# Patient Record
Sex: Female | Born: 1975 | Race: White | Hispanic: No | Smoking: Former smoker
Health system: Southern US, Community
[De-identification: ages and names within clinical notes are randomized; demographics above are authoritative.]

## PROBLEM LIST (undated history)

## (undated) DIAGNOSIS — E7800 Pure hypercholesterolemia, unspecified: Secondary | ICD-10-CM

## (undated) DIAGNOSIS — G43909 Migraine, unspecified, not intractable, without status migrainosus: Secondary | ICD-10-CM

## (undated) DIAGNOSIS — K219 Gastro-esophageal reflux disease without esophagitis: Secondary | ICD-10-CM

## (undated) DIAGNOSIS — M419 Scoliosis, unspecified: Secondary | ICD-10-CM

## (undated) DIAGNOSIS — G459 Transient cerebral ischemic attack, unspecified: Secondary | ICD-10-CM

## (undated) HISTORY — DX: Migraine, unspecified, not intractable, without status migrainosus: G43.909

## (undated) HISTORY — DX: Pure hypercholesterolemia, unspecified: E78.00

## (undated) HISTORY — PX: ELBOW SURGERY: SHX618

---

## 2010-07-15 ENCOUNTER — Other Ambulatory Visit: Admission: RE | Admit: 2010-07-15 | Discharge: 2010-07-15 | Payer: Self-pay | Admitting: Gynecology

## 2010-07-15 ENCOUNTER — Ambulatory Visit: Payer: Self-pay | Admitting: Gynecology

## 2010-07-18 ENCOUNTER — Ambulatory Visit: Payer: Self-pay | Admitting: Gynecology

## 2011-09-15 ENCOUNTER — Other Ambulatory Visit (HOSPITAL_COMMUNITY)
Admission: RE | Admit: 2011-09-15 | Discharge: 2011-09-15 | Disposition: A | Discharge status: Home / Self Care | Payer: 59 | Source: Ambulatory Visit | Attending: Gynecology | Admitting: Gynecology

## 2011-09-15 ENCOUNTER — Ambulatory Visit (INDEPENDENT_AMBULATORY_CARE_PROVIDER_SITE_OTHER): Payer: 59 | Admitting: Gynecology

## 2011-09-15 ENCOUNTER — Encounter: Payer: Self-pay | Admitting: Gynecology

## 2011-09-15 VITALS — BP 110/70 | Ht 62.5 in | Wt 143.0 lb

## 2011-09-15 DIAGNOSIS — Z13220 Encounter for screening for lipoid disorders: Secondary | ICD-10-CM

## 2011-09-15 DIAGNOSIS — B3731 Acute candidiasis of vulva and vagina: Secondary | ICD-10-CM

## 2011-09-15 DIAGNOSIS — N898 Other specified noninflammatory disorders of vagina: Secondary | ICD-10-CM

## 2011-09-15 DIAGNOSIS — Z01419 Encounter for gynecological examination (general) (routine) without abnormal findings: Secondary | ICD-10-CM

## 2011-09-15 DIAGNOSIS — B373 Candidiasis of vulva and vagina: Secondary | ICD-10-CM

## 2011-09-15 DIAGNOSIS — Z131 Encounter for screening for diabetes mellitus: Secondary | ICD-10-CM

## 2011-09-15 DIAGNOSIS — J45909 Unspecified asthma, uncomplicated: Secondary | ICD-10-CM | POA: Insufficient documentation

## 2011-09-15 MED ORDER — FLUCONAZOLE 150 MG PO TABS: 150.0000 mg | ORAL_TABLET | Freq: Once | ORAL | Status: AC

## 2011-09-15 NOTE — Progress Notes (Signed)
[name] [name] 11/25/1975 [id number]        36 y.o.  for annual exam.  Doing well no complaints using rhythm method of contraception.  Past medical history,surgical history, medications, allergies, family history and social history were all reviewed and documented in the EPIC chart. ROS:  Was performed and pertinent positives and negatives are included in the history.  Exam: chaperone present Filed Vitals:   09/15/11 1541  BP: 110/70   General appearance  Normal Skin grossly normal Head/Neck normal with no cervical or supraclavicular adenopathy thyroid normal Lungs  clear Cardiac RR, without RMG Abdominal  soft, nontender, without masses, organomegaly or hernia Breasts  examined lying and sitting without masses, retractions, discharge or axillary adenopathy. Pelvic  Ext/BUS/vagina  normal with white discharge  Cervix  normal  Pap done  Uterus  anteverted, normal size, shape and contour, midline and mobile nontender   Adnexa  Without masses or tenderness    Anus and perineum  normal   Rectovaginal  normal sphincter tone without palpated masses or tenderness.    Assessment/Plan:  35 y.o. female for annual exam.    1. White discharge. KOH wet prep is positive for yeast we'll treat with Diflucan 150x1 dose follow up if symptoms persist or recur 2. Contraception. Patient is using rhythm method. She understands the risk of failure and is on a multivitamin with folic acid. They're contemplating pregnancy. I discussed the issues of pregnancy in an older woman to include decreased fecundity, increased risk of medical issues such as gestational diabetes and gestational hypertension as well as increased risk of chromosomal abnormalities in the fetus. She knows I do not do obstetrics and if she chooses pregnancy then we'll refer to an obstetrician. Otherwise she will see [place] in a year. 3. Health maintenance. Self breast exams on a monthly basis discussed urged.  Baseline labs to include CBC urinalysis  lipid profile glucose were ordered. Patient will see me in a year see me she continues well sooner as needed.    [name] MD, 4:05 PM 09/15/2011

## 2011-09-16 ENCOUNTER — Other Ambulatory Visit (INDEPENDENT_AMBULATORY_CARE_PROVIDER_SITE_OTHER): Payer: 59 | Admitting: *Deleted

## 2011-09-16 DIAGNOSIS — Z131 Encounter for screening for diabetes mellitus: Secondary | ICD-10-CM

## 2011-09-16 DIAGNOSIS — Z01419 Encounter for gynecological examination (general) (routine) without abnormal findings: Secondary | ICD-10-CM

## 2011-09-16 DIAGNOSIS — Z13220 Encounter for screening for lipoid disorders: Secondary | ICD-10-CM

## 2011-09-24 ENCOUNTER — Telehealth: Payer: Self-pay | Admitting: *Deleted

## 2011-09-24 NOTE — Telephone Encounter (Signed)
Pt informed she  will need a repeat pap due to not enough cells collected during original pap. Pt will scheduled appointment.

## 2011-10-05 ENCOUNTER — Other Ambulatory Visit (HOSPITAL_COMMUNITY)
Admission: RE | Admit: 2011-10-05 | Discharge: 2011-10-05 | Disposition: A | Discharge status: Home / Self Care | Payer: 59 | Source: Ambulatory Visit | Attending: Gynecology | Admitting: Gynecology

## 2011-10-05 ENCOUNTER — Encounter: Payer: Self-pay | Admitting: Gynecology

## 2011-10-05 ENCOUNTER — Ambulatory Visit (INDEPENDENT_AMBULATORY_CARE_PROVIDER_SITE_OTHER): Payer: 59 | Admitting: Gynecology

## 2011-10-05 DIAGNOSIS — Z01419 Encounter for gynecological examination (general) (routine) without abnormal findings: Secondary | ICD-10-CM | POA: Insufficient documentation

## 2011-10-05 DIAGNOSIS — R87616 Satisfactory cervical smear but lacking transformation zone: Secondary | ICD-10-CM

## 2011-10-05 NOTE — Progress Notes (Signed)
Patient also for Pap smear. Her recent Pap smear was in adequate in that no endocervical cells were seen.  Exam Pelvic external BUS vagina normal cervix with stenotic external os. Pap was repeated noting endocervical mucus on the brush.  Follow up Pap smear, assuming normal then she'll follow up routinely with she's due for her next annual. Her husband are attempting pregnancy she's a multivitamin with folic acid and will follow up when she misses a menses. Otherwise she'll see [place] in a year.

## 2011-12-21 ENCOUNTER — Telehealth: Payer: Self-pay | Admitting: *Deleted

## 2011-12-21 NOTE — Telephone Encounter (Signed)
Pt informed with the below note. 

## 2011-12-21 NOTE — Telephone Encounter (Signed)
OTC "prenatal vitamins" okay

## 2011-12-21 NOTE — Telephone Encounter (Signed)
Pt spoke with you at 09/15/11 office visit about trying to conceive. She would like to know if there are any special prenatal vit. That she would need? OTC okay or Rx?

## 2012-01-22 DIAGNOSIS — G459 Transient cerebral ischemic attack, unspecified: Secondary | ICD-10-CM

## 2012-01-22 HISTORY — DX: Transient cerebral ischemic attack, unspecified: G45.9

## 2012-02-14 ENCOUNTER — Observation Stay (HOSPITAL_COMMUNITY): Payer: 59

## 2012-02-14 ENCOUNTER — Emergency Department (HOSPITAL_COMMUNITY): Payer: 59

## 2012-02-14 ENCOUNTER — Observation Stay (HOSPITAL_COMMUNITY)
Admission: EM | Admit: 2012-02-14 | Discharge: 2012-02-16 | Disposition: A | Discharge status: Home / Self Care | Payer: 59 | Source: Ambulatory Visit | Attending: Family Medicine | Admitting: Family Medicine

## 2012-02-14 ENCOUNTER — Other Ambulatory Visit: Payer: Self-pay

## 2012-02-14 ENCOUNTER — Encounter (HOSPITAL_COMMUNITY): Payer: Self-pay | Admitting: *Deleted

## 2012-02-14 DIAGNOSIS — Z23 Encounter for immunization: Secondary | ICD-10-CM | POA: Insufficient documentation

## 2012-02-14 DIAGNOSIS — G459 Transient cerebral ischemic attack, unspecified: Principal | ICD-10-CM | POA: Diagnosis present

## 2012-02-14 DIAGNOSIS — J45909 Unspecified asthma, uncomplicated: Secondary | ICD-10-CM | POA: Diagnosis present

## 2012-02-14 DIAGNOSIS — R079 Chest pain, unspecified: Secondary | ICD-10-CM | POA: Diagnosis present

## 2012-02-14 DIAGNOSIS — E785 Hyperlipidemia, unspecified: Secondary | ICD-10-CM | POA: Diagnosis present

## 2012-02-14 DIAGNOSIS — K219 Gastro-esophageal reflux disease without esophagitis: Secondary | ICD-10-CM | POA: Diagnosis present

## 2012-02-14 HISTORY — DX: Scoliosis, unspecified: M41.9

## 2012-02-14 HISTORY — DX: Gastro-esophageal reflux disease without esophagitis: K21.9

## 2012-02-14 LAB — CBC
HCT: 36.7 % (ref 36.0–46.0)
Hemoglobin: 12 g/dL (ref 12.0–15.0)
MCH: 26.4 pg (ref 26.0–34.0)
MCHC: 32.7 g/dL (ref 30.0–36.0)
MCV: 80.8 fL (ref 78.0–100.0)
Platelets: 190 10*3/uL (ref 150–400)
RBC: 4.54 MIL/uL (ref 3.87–5.11)
RDW: 12.7 % (ref 11.5–15.5)
WBC: 8.5 10*3/uL (ref 4.0–10.5)

## 2012-02-14 LAB — COMPREHENSIVE METABOLIC PANEL
ALT: 16 U/L (ref 0–35)
AST: 19 U/L (ref 0–37)
Albumin: 3.9 g/dL (ref 3.5–5.2)
Alkaline Phosphatase: 47 U/L (ref 39–117)
BUN: 18 mg/dL (ref 6–23)
CO2: 22 mEq/L (ref 19–32)
Calcium: 9.4 mg/dL (ref 8.4–10.5)
Chloride: 102 mEq/L (ref 96–112)
Creatinine, Ser: 0.78 mg/dL (ref 0.50–1.10)
GFR calc Af Amer: >90 mL/min (ref >90)
GFR calc non Af Amer: >90 mL/min (ref >90)
Glucose, Bld: 105 mg/dL — ABNORMAL HIGH (ref 70–99)
Potassium: 3.4 mEq/L — ABNORMAL LOW (ref 3.5–5.1)
Sodium: 137 mEq/L (ref 135–145)
Total Bilirubin: 0.5 mg/dL (ref 0.3–1.2)
Total Protein: 6.4 g/dL (ref 6.0–8.3)

## 2012-02-14 LAB — PROTIME-INR
INR: 1.08 (ref 0.00–1.49)
Prothrombin Time: 14.2 seconds (ref 11.6–15.2)

## 2012-02-14 LAB — DIFFERENTIAL
Basophils Absolute: 0 10*3/uL (ref 0.0–0.1)
Basophils Relative: 1 % (ref 0–1)
Eosinophils Absolute: 0.3 10*3/uL (ref 0.0–0.7)
Eosinophils Relative: 3 % (ref 0–5)
Lymphocytes Relative: 37 % (ref 12–46)
Lymphs Abs: 3.1 10*3/uL (ref 0.7–4.0)
Monocytes Absolute: 0.6 10*3/uL (ref 0.1–1.0)
Monocytes Relative: 7 % (ref 3–12)
Neutro Abs: 4.5 10*3/uL (ref 1.7–7.7)
Neutrophils Relative %: 52 % (ref 43–77)

## 2012-02-14 LAB — POCT I-STAT, CHEM 8
BUN: 19 mg/dL (ref 6–23)
Calcium, Ion: 1.2 mmol/L (ref 1.12–1.32)
Chloride: 104 mEq/L (ref 96–112)
Creatinine, Ser: 0.9 mg/dL (ref 0.50–1.10)
Glucose, Bld: 110 mg/dL — ABNORMAL HIGH (ref 70–99)
HCT: 37 % (ref 36.0–46.0)
Hemoglobin: 12.6 g/dL (ref 12.0–15.0)
Potassium: 3.4 mEq/L — ABNORMAL LOW (ref 3.5–5.1)
Sodium: 140 mEq/L (ref 135–145)
TCO2: 24 mmol/L (ref 0–100)

## 2012-02-14 LAB — CARDIAC PANEL(CRET KIN+CKTOT+MB+TROPI)
CK, MB: 1.6 ng/mL (ref 0.3–4.0)
Relative Index: INVALID (ref 0.0–2.5)
Total CK: 79 U/L (ref 7–177)
Troponin I: <0.3 ng/mL (ref <0.30)

## 2012-02-14 LAB — APTT: aPTT: 28 seconds (ref 24–37)

## 2012-02-14 LAB — GLUCOSE, CAPILLARY: Glucose-Capillary: 134 mg/dL — ABNORMAL HIGH (ref 70–99)

## 2012-02-14 LAB — CK TOTAL AND CKMB (NOT AT ARMC)
CK, MB: 1.6 ng/mL (ref 0.3–4.0)
Relative Index: INVALID (ref 0.0–2.5)
Total CK: 82 U/L (ref 7–177)

## 2012-02-14 LAB — POCT PREGNANCY, URINE: Preg Test, Ur: NEGATIVE

## 2012-02-14 LAB — TROPONIN I: Troponin I: <0.3 ng/mL (ref <0.30)

## 2012-02-14 MED ORDER — POTASSIUM CHLORIDE 10 MEQ/100ML IV SOLN
10.0000 meq | INTRAVENOUS | Status: AC
  Administered 2012-02-14 – 2012-02-15 (×2): 10 meq via INTRAVENOUS
  Filled 2012-02-14 (×2): qty 100

## 2012-02-14 MED ORDER — LEVALBUTEROL TARTRATE 45 MCG/ACT IN AERO
1.0000 | INHALATION_SPRAY | RESPIRATORY_TRACT | Status: DC | PRN
  Filled 2012-02-14: qty 15

## 2012-02-14 MED ORDER — ASPIRIN 325 MG PO TABS
325.0000 mg | ORAL_TABLET | Freq: Every day | ORAL | Status: DC
  Administered 2012-02-15 – 2012-02-16 (×2): 325 mg via ORAL
  Filled 2012-02-14 (×2): qty 1

## 2012-02-14 MED ORDER — IOHEXOL 350 MG/ML SOLN
50.0000 mL | Freq: Once | INTRAVENOUS | Status: AC | PRN
  Administered 2012-02-14: 50 mL via INTRAVENOUS

## 2012-02-14 MED ORDER — FLUTICASONE PROPIONATE 50 MCG/ACT NA SUSP
1.0000 | Freq: Every day | NASAL | Status: DC
  Administered 2012-02-15 – 2012-02-16 (×2): 1 via NASAL
  Filled 2012-02-14: qty 16

## 2012-02-14 MED ORDER — ASPIRIN 81 MG PO CHEW
CHEWABLE_TABLET | ORAL | Status: AC
  Administered 2012-02-14: 324 mg
  Filled 2012-02-14: qty 4

## 2012-02-14 MED ORDER — HYDROMORPHONE HCL PF 1 MG/ML IJ SOLN
1.0000 mg | INTRAMUSCULAR | Status: DC | PRN
  Filled 2012-02-14: qty 1

## 2012-02-14 MED ORDER — PNEUMOCOCCAL VAC POLYVALENT 25 MCG/0.5ML IJ INJ
0.5000 mL | INJECTION | INTRAMUSCULAR | Status: AC
  Administered 2012-02-15: 0.5 mL via INTRAMUSCULAR
  Filled 2012-02-14: qty 0.5

## 2012-02-14 MED ORDER — ENOXAPARIN SODIUM 40 MG/0.4ML ~~LOC~~ SOLN
40.0000 mg | SUBCUTANEOUS | Status: DC
  Administered 2012-02-15 – 2012-02-16 (×2): 40 mg via SUBCUTANEOUS
  Filled 2012-02-14 (×2): qty 0.4

## 2012-02-14 NOTE — ED Notes (Signed)
Patient arrived from CT.  Dr. Roseanne Reno at the bedside along with the rapid response.

## 2012-02-14 NOTE — ED Notes (Signed)
EDP notified who came in to assess pt and peak with her

## 2012-02-14 NOTE — ED Notes (Signed)
Pt was at dinner when she began having left sided CP and numbness on the left side of body ("entire left side of body feels different"). Pt has some sob with this, pt also has some nausea with this.  No diaphoresis.  Pt is alert and oriented at this time.  This began 19:30

## 2012-02-14 NOTE — Code Documentation (Signed)
35 yo wf brought in by pvt vehicle for onset of stroke symptoms at 103.  Pt was eating dinner with her family when she noticed an acute onset on left sided numbness and tingling from face to foot.  LSN [id number], code stroke called 2024, pt arival 2007, EDP exam 2035, stroke team arrival 2035, pt arrival in CT 2034, CT read by neurologist 2038, phlebotomist arrival 2038.  Pt also with CTA head & neck completed.  Code stroke canceled 2103 by Dr. Roseanne Reno.

## 2012-02-14 NOTE — ED Notes (Signed)
Denies pain refused Dilaudid

## 2012-02-14 NOTE — H&P (Signed)
DATE OF ADMISSION:  02/14/2012  PCP:    Benita Stabile, MD, MD   Chief Complaint:  Weakness and Numbness Left Side   HPI: [name] [name] is an 36 y.o. female who presents to the ED with complaints of sudden onset weakness and numbness of the left side of her body at 7:30 pm.  She denied having headache but did report having chest pain.  She presented to the ED and A Code Stroke was called.  She was seen by Neurology and her symptoms were beginning to abate, so the code stroke was cancelled, and her CT scan of the Brain returned with negative.  She was referred for admission for further workup.    Past Medical History  Diagnosis Date  . Asthma   . GERD (gastroesophageal reflux disease)   . Scoliosis     Past Surgical History  Procedure Date  . Cesarean section 1997    boy    Medications:  HOME MEDS: Prior to Admission medications   Medication Sig Start Date End Date Taking? Authorizing Provider  levalbuterol [facility] HFA) 45 MCG/ACT inhaler Inhale 1-2 puffs into the lungs every 4 (four) hours as needed. For asthma.   Yes Historical Provider, MD  mometasone (NASONEX) 50 MCG/ACT nasal spray Place 2 sprays into the nose daily.     Yes Historical Provider, MD  montelukast (SINGULAIR) 10 MG tablet Take 10 mg by mouth at bedtime.   Yes Historical Provider, MD  Multiple Vitamin (MULITIVITAMIN WITH MINERALS) TABS Take 1 tablet by mouth daily.   Yes Historical Provider, MD    Allergies:  No Known Allergies  Social History:   reports that she quit smoking about 8 months ago. She has never used smokeless tobacco. She reports that she does not drink alcohol or use illicit drugs.  Family History: Family History  Problem Relation Age of Onset  . Diabetes Father   . Colon cancer Father   . Hypertension Father   . Hyperlipidemia Father   . Cancer Father     prostate  . Cancer Maternal Uncle   . Cancer Maternal Grandfather   . Cancer Paternal Grandmother     stomach  . Cancer  Paternal Grandfather     lung    Review of Systems:  The patient denies anorexia, fever, weight loss,, vision loss, decreased hearing, hoarseness, syncope, dyspnea on exertion, peripheral edema, balance deficits, hemoptysis, abdominal pain, melena, hematochezia, severe indigestion/heartburn, hematuria, incontinence, genital sores, muscle weakness, suspicious skin lesions, transient blindness, difficulty walking, depression, unusual weight change, abnormal bleeding, enlarged lymph nodes, angioedema, and breast masses.   Physical Exam:  GEN:  Pleasant 36 year old well nourished and well developed Caucasian female examined  and in no acute distress; cooperative with exam Filed Vitals:   02/14/12 2007 02/14/12 2049 02/14/12 2108 02/14/12 2122  BP: 122/70 119/64  115/67  Pulse:  84    Temp: 97.9 F (36.6 C)  99.2 F (37.3 C)   TempSrc: Oral     Resp: 18   16  SpO2: 100% 100%  99%   Blood pressure 115/67, pulse 84, temperature 99.2 F (37.3 C), temperature source Oral, resp. rate 16, last menstrual period 01/31/2012, SpO2 99.00%. PSYCH: SHe is alert and oriented x4; does not appear anxious does not appear depressed; affect is normal HEENT: Normocephalic and Atraumatic, Mucous membranes pink; PERRLA; EOM intact; Fundi:  Benign;  No scleral icterus, Nares: Patent, Oropharynx: Clear, Fair Dentition, Neck:  FROM, no cervical lymphadenopathy nor thyromegaly or carotid  bruit; no JVD; Breasts:: Not examined CHEST WALL: No tenderness CHEST: Normal respiration, clear to auscultation bilaterally HEART: Regular rate and rhythm; no murmurs rubs or gallops BACK: No kyphosis or scoliosis; no CVA tenderness ABDOMEN: Positive Bowel Sounds, Soft non-tender; no masses, no organomegaly.   Rectal Exam: Not done EXTREMITIES: No cyanosis, clubbing or edema; no ulcerations. Genitalia: not examined PULSES: 2+ and symmetric SKIN: Normal hydration no rash or ulceration CNS: Cranial nerves 2-12 grossly intact no  focal neurologic deficit, No Pronator drifting, Equal Grip strength, Able to move all 4 extremities, Gait was not assessed.     Labs & Imaging Results for orders placed during the hospital encounter of 02/14/12 (from the past 48 hour(s))  GLUCOSE, CAPILLARY     Status: Abnormal   Collection Time   02/14/12  8:21 PM      Component Value Range Comment   Glucose-Capillary 134 (*) 70 - 99 (mg/dL)   PROTIME-INR     Status: Normal   Collection Time   02/14/12  8:31 PM      Component Value Range Comment   Prothrombin Time 14.2  11.6 - 15.2 (seconds)    INR 1.08  0.00 - 1.49    APTT     Status: Normal   Collection Time   02/14/12  8:31 PM      Component Value Range Comment   aPTT 28  24 - 37 (seconds)   CBC     Status: Normal   Collection Time   02/14/12  8:31 PM      Component Value Range Comment   WBC 8.5  4.0 - 10.5 (K/uL)    RBC 4.54  3.87 - 5.11 (MIL/uL)    Hemoglobin 12.0  12.0 - 15.0 (g/dL)    HCT 16.1  09.6 - 04.5 (%)    MCV 80.8  78.0 - 100.0 (fL)    MCH 26.4  26.0 - 34.0 (pg)    MCHC 32.7  30.0 - 36.0 (g/dL)    RDW 40.9  81.1 - 91.4 (%)    Platelets 190  150 - 400 (K/uL)   DIFFERENTIAL     Status: Normal   Collection Time   02/14/12  8:31 PM      Component Value Range Comment   Neutrophils Relative 52  43 - 77 (%)    Neutro Abs 4.5  1.7 - 7.7 (K/uL)    Lymphocytes Relative 37  12 - 46 (%)    Lymphs Abs 3.1  0.7 - 4.0 (K/uL)    Monocytes Relative 7  3 - 12 (%)    Monocytes Absolute 0.6  0.1 - 1.0 (K/uL)    Eosinophils Relative 3  0 - 5 (%)    Eosinophils Absolute 0.3  0.0 - 0.7 (K/uL)    Basophils Relative 1  0 - 1 (%)    Basophils Absolute 0.0  0.0 - 0.1 (K/uL)   COMPREHENSIVE METABOLIC PANEL     Status: Abnormal   Collection Time   02/14/12  8:31 PM      Component Value Range Comment   Sodium 137  135 - 145 (mEq/L)    Potassium 3.4 (*) 3.5 - 5.1 (mEq/L)    Chloride 102  96 - 112 (mEq/L)    CO2 22  19 - 32 (mEq/L)    Glucose, Bld 105 (*) 70 - 99 (mg/dL)    BUN 18   6 - 23 (mg/dL)    Creatinine, Ser 7.82  0.50 - 1.10 (mg/dL)  Calcium 9.4  8.4 - 10.5 (mg/dL)    Total Protein 6.4  6.0 - 8.3 (g/dL)    Albumin 3.9  3.5 - 5.2 (g/dL)    AST 19  0 - 37 (U/L)    ALT 16  0 - 35 (U/L)    Alkaline Phosphatase 47  39 - 117 (U/L)    Total Bilirubin 0.5  0.3 - 1.2 (mg/dL)    GFR calc non Af Amer >90  >90 (mL/min)    GFR calc Af Amer >90  >90 (mL/min)   CK TOTAL AND CKMB     Status: Normal   Collection Time   02/14/12  8:33 PM      Component Value Range Comment   Total CK 82  7 - 177 (U/L)    CK, MB 1.6  0.3 - 4.0 (ng/mL)    Relative Index RELATIVE INDEX IS INVALID  0.0 - 2.5    TROPONIN I     Status: Normal   Collection Time   02/14/12  8:33 PM      Component Value Range Comment   Troponin I <0.30  <0.30 (ng/mL)   POCT I-STAT, CHEM 8     Status: Abnormal   Collection Time   02/14/12  8:50 PM      Component Value Range Comment   Sodium 140  135 - 145 (mEq/L)    Potassium 3.4 (*) 3.5 - 5.1 (mEq/L)    Chloride 104  96 - 112 (mEq/L)    BUN 19  6 - 23 (mg/dL)    Creatinine, Ser 1.61  0.50 - 1.10 (mg/dL)    Glucose, Bld 096 (*) 70 - 99 (mg/dL)    Calcium, Ion 0.45  1.12 - 1.32 (mmol/L)    TCO2 24  0 - 100 (mmol/L)    Hemoglobin 12.6  12.0 - 15.0 (g/dL)    HCT 40.9  81.1 - 91.4 (%)   POCT PREGNANCY, URINE     Status: Normal   Collection Time   02/14/12  9:52 PM      Component Value Range Comment   Preg Test, Ur NEGATIVE  NEGATIVE     Ct Head Wo Contrast  02/14/2012   *RADIOLOGY REPORT*  Clinical Data:  Left side weakness.  CT HEAD WITHOUT CONTRAST  Technique: Contiguous axial images were obtained from the base of the skull through the vertex without intravenous contrast.  Comparison:   None.  Findings:  The brain appears normal without evidence of acute infarction, hemorrhage, mass lesion, mass effect, midline shift or abnormal extra-axial fluid collection.  There is no hydrocephalus or pneumocephalus.  Minimal ethmoid air cell disease is noted. Calvarium  intact.  IMPRESSION: No acute finding.  Findings called to Dr. Fonnie Jarvis at the time of interpretation.  Original Report Authenticated By: Bernadene Bell. D'ALESSIO, M.D.     EKG:  NSR No Acute ST segment changes, Diffuse artifact present.      Assessment: Present on Admission:  .TIA (transient ischemic attack) .Chest pain .Hyperlipidemia .GERD (gastroesophageal reflux disease) .Asthma   Plan:    Admit to 23 Hour Observation Status for TIA Workup Neuro checks, MRI/MRA in AM.  Cardiac Enzymes Reconcile Home Medications Check Fasting Lipid Panel ASA therapy DVT prophylaxis Other plans as per orders.    CODE STATUS:      FULL CODE        Sheletha Bow C 02/14/2012, 10:39 PM

## 2012-02-14 NOTE — ED Notes (Signed)
NIH scale done by April.  Information does not show up on the ED information but will show up on the information for the floor

## 2012-02-14 NOTE — ED Notes (Signed)
Patient refused xray stating she has been trying to get pregnant. Dr. Fonnie Jarvis is aware.

## 2012-02-14 NOTE — Consult Note (Signed)
Referring Physician: Dr. Fonnie Jarvis    Chief Complaint: Sudden onset of left face arm and leg numbness as well as mild weakness involving left extremities.  HPI: [name] [name] is an 36 y.o. female with a history of asthma and GERD presenting with sudden onset of numbness involving left face arm and leg as well as mild weakness involving left arm and leg at about 1930 this evening while eating dinner. She is no previous history of stroke nor TIA. He was also no history of migraine headaches. Patient has not experienced a headache with current symptoms. She also has not experienced changes in cognitive functioning. She's had no problems with speech no swallowing. She has not been on antiplatelet therapy. CT scan of her head showed no acute intracranial abnormality. CT of the head and neck with contrast was completed. Results are pending. NIH stroke score was 3. Patient's symptoms were resolving. She was not considered a candidate for intravenous thrombolytic therapy with TPA.  LSN: 1930 today tPA Given: No: Mild deficits which were resolving. MRankin: 0  Past Medical History  Diagnosis Date  . Asthma   . GERD (gastroesophageal reflux disease)     Family History  Problem Relation Age of Onset  . Diabetes Father   . Colon cancer Father   . Hypertension Father   . Hyperlipidemia Father   . Cancer Father     prostate  . Cancer Maternal Uncle   . Cancer Maternal Grandfather   . Cancer Paternal Grandmother     stomach  . Cancer Paternal Grandfather     lung     Medications: Prior to Admission: Xopenex 1-2 puffs every 4 hours when necessary  Nasonex nasal spray 2 sprays in each nostril daily  Singulair 10 mg at bedtime  Multivitamin 1 per day  Physical Examination: Blood pressure 119/64, pulse 84, temperature 99.2 F (37.3 C), temperature source Oral, resp. rate 18, last menstrual period 01/31/2012, SpO2 100.00%.  Neurologic Examination: Mental Status: Alert, oriented, thought content  appropriate.  Speech fluent without evidence of aphasia. Able to follow commands without difficulty. Cranial Nerves: II-Visual fields intact and normal. III/IV/VI-Extraocular movements full and conjugate.  Pupils reacted normally to light. V/VII-slight left facial numbness; no facial weakness VIII-grossly intact X-normal speech XII-midline tongue extension Motor: Minimal drift of left arm and left leg against gravity, otherwise normal. Sensory: Pinprick and light touch slightly reduced over left extremities compared to the right extremities Deep Tendon Reflexes: 2+ and symmetric throughout Plantars: Downgoing bilaterally Cerebellar: Normal finger-to-nose and  heel-to-shin test.   Carotid auscultation: Normal   Ct Head Wo Contrast  02/14/2012   *RADIOLOGY REPORT*  Clinical Data:  Left side weakness.  CT HEAD WITHOUT CONTRAST  Technique: Contiguous axial images were obtained from the base of the skull through the vertex without intravenous contrast.  Comparison:   None.  Findings:  The brain appears normal without evidence of acute infarction, hemorrhage, mass lesion, mass effect, midline shift or abnormal extra-axial fluid collection.  There is no hydrocephalus or pneumocephalus.  Minimal ethmoid air cell disease is noted. Calvarium intact.  IMPRESSION: No acute finding.  Findings called to Dr. Fonnie Jarvis at the time of interpretation.  Original Report Authenticated By: Bernadene Bell. Maricela Curet, M.D.    Assessment: 36 y.o. female with probable right subcortical ischemic small vessel event, most likely TIA. However, small acute stroke cannot be ruled out.  Stroke Risk Factors - none  Plan: 1. HgbA1c, fasting lipid panel 2. MRI  of the brain without contrast  3. PT consult, OT consult, Speech consult 4. Echocardiogram  5. Hypercoagulopathy workup 6. Prophylactic therapy-aspirin 325 mg per day 7. Risk factor modification 8. Telemetry monitoring  C.R. Roseanne Reno, MD [facility] [phone]  02/14/2012, 9:17 PM

## 2012-02-14 NOTE — ED Provider Notes (Signed)
History     CSN: [id number]  Arrival date & time 02/14/12  2000   First MD Initiated Contact with Patient 02/14/12 2022      Chief Complaint  Patient presents with  . Code Stroke    (Consider location/radiation/quality/duration/timing/severity/associated sxs/prior treatment) HPI This 36 year old female has recently been quite healthy she is a history of asthma with no recent exacerbations until was eating dinner tonight at 7:00 when she had the sudden onset of numbness to the entire left half of her body including her face arm and leg. She had no pain no headache. There is no trauma. It was mild to moderate numbness which is improving but not resolved completely yet. She did not really notice weakness at home but upon questioning she realizes that her left arm and left leg he feels slightly heavy and slightly weak. She has no chest pain palpitations lightheadedness or vertigo. She has no change in speech or vision swallowing or understanding. The symptoms occurred an hour half prior to my initial examination. Again she had sudden onset of symptoms at 7:00 this evening. She has not had migraines or complicated migraines or strokelike symptoms in the past. Her symptoms are constant but improving and they are mild. There was no treatment prior to arrival to Past Medical History  Diagnosis Date  . Asthma   . GERD (gastroesophageal reflux disease)   . Scoliosis     Past Surgical History  Procedure Date  . Cesarean section 65    boy    Family History  Problem Relation Age of Onset  . Diabetes Father   . Colon cancer Father   . Hypertension Father   . Hyperlipidemia Father   . Cancer Father     prostate  . Cancer Maternal Uncle   . Cancer Maternal Grandfather   . Cancer Paternal Grandmother     stomach  . Cancer Paternal Grandfather     lung    History  Substance Use Topics  . Smoking status: Former Smoker    Quit date: 06/12/2011  . Smokeless tobacco: Never Used  .  Alcohol Use: No    OB History    Grav Para Term Preterm Abortions TAB SAB Ect Mult Living   2 1   1 1    1       Review of Systems  Constitutional: Negative for fever.       10 Systems reviewed and are negative for acute change except as noted in the HPI.  HENT: Negative for congestion.   Eyes: Negative for discharge and redness.  Respiratory: Negative for cough and shortness of breath.   Cardiovascular: Negative for chest pain.  Gastrointestinal: Negative for vomiting and abdominal pain.  Musculoskeletal: Negative for back pain.  Skin: Negative for rash.  Neurological: Positive for weakness and numbness. Negative for dizziness, syncope, facial asymmetry, speech difficulty, light-headedness and headaches.  Psychiatric/Behavioral:       No behavior change.    Allergies  Review of patient's allergies indicates no known allergies.  Home Medications   No current outpatient prescriptions on file.  BP 95/62  Pulse 66  Temp(Src) 98.4 F (36.9 C) (Oral)  Resp 18  Ht 5\' 2"  (1.575 m)  Wt 124 lb 12.5 oz (56.6 kg)  BMI 22.82 kg/m2  SpO2 94%  LMP 01/31/2012  Physical Exam  Nursing note and vitals reviewed. Constitutional:       Awake, alert, nontoxic appearance with baseline speech for patient.  HENT:  Head: Atraumatic.  Mouth/Throat: No oropharyngeal exudate.  Eyes: EOM are normal. Pupils are equal, round, and reactive to light. Right eye exhibits no discharge. Left eye exhibits no discharge.  Neck: Neck supple.  Cardiovascular: Normal rate and regular rhythm.   No murmur heard. Pulmonary/Chest: Effort normal and breath sounds normal. No stridor. No respiratory distress. She has no wheezes. She has no rales. She exhibits no tenderness.  Abdominal: Soft. Bowel sounds are normal. She exhibits no mass. There is no tenderness. There is no rebound.  Musculoskeletal: She exhibits no edema and no tenderness.       Baseline ROM, moves extremities with no obvious new focal weakness.   Lymphadenopathy:    She has no cervical adenopathy.  Neurological: She is alert.       Awake, alert, cooperative and aware of situation; motor strength 5 out of 5 in her right side and 4+ out of 5 in her left arm and left leg; sensation normal light touch to her right face arm trunk and leg with slight decreased light touch to her left face trunk arm and leg; peripheral visual fields full to confrontation; no facial asymmetry; tongue midline; major cranial nerves appear intact; mild left arm and leg pronator drift, normal finger to nose bilaterally  Skin: No rash noted.  Psychiatric: She has a normal mood and affect.    ED Course  Procedures (including critical care time) ECG: Normal sinus rhythm, ventricular rate 77, normal axis, normal intervals, impression normal ECG, no comparison ECG available Labs Reviewed  GLUCOSE, CAPILLARY - Abnormal; Notable for the following:    Glucose-Capillary 134 (*)    All other components within normal limits  COMPREHENSIVE METABOLIC PANEL - Abnormal; Notable for the following:    Potassium 3.4 (*)    Glucose, Bld 105 (*)    All other components within normal limits  POCT I-STAT, CHEM 8 - Abnormal; Notable for the following:    Potassium 3.4 (*)    Glucose, Bld 110 (*)    All other components within normal limits  PROTIME-INR  APTT  CBC  DIFFERENTIAL  CK TOTAL AND CKMB  TROPONIN I  POCT PREGNANCY, URINE  LIPID PANEL  CARDIAC PANEL(CRET KIN+CKTOT+MB+TROPI)  CARDIAC PANEL(CRET KIN+CKTOT+MB+TROPI)  URINE RAPID DRUG SCREEN (HOSP PERFORMED)  HEMOGLOBIN A1C  CARDIAC PANEL(CRET KIN+CKTOT+MB+TROPI)  GLUCOSE, CAPILLARY  GLUCOSE, CAPILLARY  GLUCOSE, CAPILLARY   Ct Angio Head W/cm &/or Wo Cm  02/15/2012  *RADIOLOGY REPORT*  Clinical Data:  Sudden onset of left face, arm, and leg numbness as well as mild weakness involving the left arm and leg, now resolving. Suspected TIA.  CT ANGIOGRAPHY HEAD AND NECK  Technique:  Multidetector CT imaging of the head  and neck was performed using the standard protocol during bolus administration of intravenous contrast.  Multiplanar CT image reconstructions including MIPs were obtained to evaluate the vascular anatomy. Carotid stenosis measurements (when applicable) are obtained utilizing NASCET criteria, using the distal internal carotid diameter as the denominator.  Contrast: 50mL OMNIPAQUE IOHEXOL 350 MG/ML IV SOLN  Comparison:  CT head earlier in the day (negative).  CTA NECK  Findings:   There is conventional branching of the great vessels from the arch.  There is no extracranial carotid or vertebral stenosis or irregularity.  No evidence for fibromuscular dysplasia or dissection is seen.  No neck masses are appreciated.  The cervical spine appears grossly unremarkable.  There is no pneumothorax or other pathology at the lung apices.   Review of the MIP images confirms the  above findings.  IMPRESSION: Negative.  CTA HEAD  Findings:  The skull base, cavernous, and supraclinoid internal carotid arteries are normal.  There is no stenosis of the anterior, middle, or posterior cerebral arteries.  Both vertebrals contribute to the formation of the basilar, with the left slightly dominant.   No cerebellar branch occlusion is seen.  There is no visible intracranial aneurysm or vascular malformation.  Post infusion, there is no abnormal enhancement of the brain.  The major dural venous sinuses appear patent.  The calvarium is intact.  Chronic sinus disease is redemonstrated.   Review of the MIP images confirms the above findings.  IMPRESSION:  Negative CT angiography of the head.  A preliminary report of these findings was generated shortly after completion of the study for the requesting MD.  Original Report Authenticated By: Elsie Stain, M.D.   Ct Head Wo Contrast  02/14/2012   *RADIOLOGY REPORT*  Clinical Data:  Left side weakness.  CT HEAD WITHOUT CONTRAST  Technique: Contiguous axial images were obtained from the base of  the skull through the vertex without intravenous contrast.  Comparison:   None.  Findings:  The brain appears normal without evidence of acute infarction, hemorrhage, mass lesion, mass effect, midline shift or abnormal extra-axial fluid collection.  There is no hydrocephalus or pneumocephalus.  Minimal ethmoid air cell disease is noted. Calvarium intact.  IMPRESSION: No acute finding.  Findings called to Dr. Fonnie Jarvis at the time of interpretation.  Original Report Authenticated By: Bernadene Bell. Maricela Curet, M.D.   Ct Angio Neck W/cm &/or Wo/cm  02/15/2012  *RADIOLOGY REPORT*  Clinical Data:  Sudden onset of left face, arm, and leg numbness as well as mild weakness involving the left arm and leg, now resolving. Suspected TIA.  CT ANGIOGRAPHY HEAD AND NECK  Technique:  Multidetector CT imaging of the head and neck was performed using the standard protocol during bolus administration of intravenous contrast.  Multiplanar CT image reconstructions including MIPs were obtained to evaluate the vascular anatomy. Carotid stenosis measurements (when applicable) are obtained utilizing NASCET criteria, using the distal internal carotid diameter as the denominator.  Contrast: 50mL OMNIPAQUE IOHEXOL 350 MG/ML IV SOLN  Comparison:  CT head earlier in the day (negative).  CTA NECK  Findings:   There is conventional branching of the great vessels from the arch.  There is no extracranial carotid or vertebral stenosis or irregularity.  No evidence for fibromuscular dysplasia or dissection is seen.  No neck masses are appreciated.  The cervical spine appears grossly unremarkable.  There is no pneumothorax or other pathology at the lung apices.   Review of the MIP images confirms the above findings.  IMPRESSION: Negative.  CTA HEAD  Findings:  The skull base, cavernous, and supraclinoid internal carotid arteries are normal.  There is no stenosis of the anterior, middle, or posterior cerebral arteries.  Both vertebrals contribute to the  formation of the basilar, with the left slightly dominant.   No cerebellar branch occlusion is seen.  There is no visible intracranial aneurysm or vascular malformation.  Post infusion, there is no abnormal enhancement of the brain.  The major dural venous sinuses appear patent.  The calvarium is intact.  Chronic sinus disease is redemonstrated.   Review of the MIP images confirms the above findings.  IMPRESSION:  Negative CT angiography of the head.  A preliminary report of these findings was generated shortly after completion of the study for the requesting MD.  Original Report Authenticated By: Jonny Ruiz  T. CURNES, M.D.   Dg Chest Portable 1 View  02/15/2012  *RADIOLOGY REPORT*  Clinical Data: 36 year old female with chest pain and TIA.  PORTABLE CHEST - 1 VIEW  Comparison: None  Findings: The cardiomediastinal silhouette is unremarkable. The lungs are clear. There is no evidence of focal airspace disease, pulmonary edema, suspicious pulmonary nodule/mass, pleural effusion, or pneumothorax. No acute bony abnormalities are identified.  IMPRESSION: No evidence of active cardiopulmonary disease.  Original Report Authenticated By: Rosendo Gros, M.D.     1. TIA (transient ischemic attack)       MDM  tPA in stroke considered, but not given due to the following: Symptoms resolved in ED Rapid improvement / severity too mild  Patient / Family / Caregiver understand and agree with initial ED impression and plan with expectations set for ED visit.  Pt stable in ED with no significant deterioration in condition.  Patient / Family / Caregiver informed of clinical course, understand medical decision-making process, and agree with plan.The patient appears reasonably stabilized for admission considering the current resources, flow, and capabilities available in the ED at this time, and I doubt any other [facility] requiring further screening and/or treatment in the ED prior to admission.Seen by Neuro in ED, d/w [facility] for  admit.      Hurman Horn, MD 02/15/12 [phone]

## 2012-02-14 NOTE — ED Notes (Signed)
COde stroke called by EDP.  Secretary notified to call out.  Pt taken to CT.  CT notified

## 2012-02-15 ENCOUNTER — Other Ambulatory Visit: Payer: Self-pay

## 2012-02-15 LAB — CARDIAC PANEL(CRET KIN+CKTOT+MB+TROPI)
CK, MB: 1.3 ng/mL (ref 0.3–4.0)
CK, MB: 1 ng/mL (ref 0.3–4.0)
Relative Index: INVALID (ref 0.0–2.5)
Relative Index: INVALID (ref 0.0–2.5)
Total CK: 74 U/L (ref 7–177)
Total CK: 55 U/L (ref 7–177)
Troponin I: <0.3 ng/mL (ref <0.30)
Troponin I: <0.3 ng/mL (ref <0.30)

## 2012-02-15 LAB — HEMOGLOBIN A1C
Hgb A1c MFr Bld: 5.1 % (ref <5.7)
Mean Plasma Glucose: 100 mg/dL (ref <117)

## 2012-02-15 LAB — LIPID PANEL
Cholesterol: 136 mg/dL (ref 0–200)
HDL: 53 mg/dL (ref >39)
LDL Cholesterol: 72 mg/dL (ref 0–99)
Total CHOL/HDL Ratio: 2.6 RATIO
Triglycerides: 54 mg/dL (ref <150)
VLDL: 11 mg/dL (ref 0–40)

## 2012-02-15 LAB — RAPID URINE DRUG SCREEN, HOSP PERFORMED
Amphetamines: NOT DETECTED
Barbiturates: NOT DETECTED
Benzodiazepines: NOT DETECTED
Cocaine: NOT DETECTED
Opiates: NOT DETECTED
Tetrahydrocannabinol: NOT DETECTED

## 2012-02-15 LAB — GLUCOSE, CAPILLARY
Glucose-Capillary: 83 mg/dL (ref 70–99)
Glucose-Capillary: 92 mg/dL (ref 70–99)
Glucose-Capillary: 98 mg/dL (ref 70–99)

## 2012-02-15 MED ORDER — MORPHINE SULFATE 2 MG/ML IJ SOLN: 1.0000 mg | INTRAMUSCULAR | Status: DC | PRN

## 2012-02-15 MED ORDER — NITROGLYCERIN 0.4 MG SL SUBL: 0.4000 mg | SUBLINGUAL_TABLET | Freq: Once | SUBLINGUAL | Status: DC

## 2012-02-15 MED ORDER — NITROGLYCERIN 0.3 MG SL SUBL: 0.3000 mg | SUBLINGUAL_TABLET | Freq: Once | SUBLINGUAL | Status: DC

## 2012-02-15 MED ORDER — SODIUM CHLORIDE 0.9 % IV SOLN
INTRAVENOUS | Status: DC
  Administered 2012-02-15 – 2012-02-16 (×2): via INTRAVENOUS

## 2012-02-15 MED ORDER — LORAZEPAM 2 MG/ML IJ SOLN: 1.0000 mg | Freq: Once | INTRAMUSCULAR | Status: DC

## 2012-02-15 MED ORDER — LORAZEPAM 2 MG/ML IJ SOLN: 1.0000 mg | INTRAMUSCULAR | Status: DC

## 2012-02-15 MED ORDER — LORAZEPAM 2 MG/ML IJ SOLN: 1.0000 mg | Freq: Once | INTRAMUSCULAR | Status: AC | PRN

## 2012-02-15 NOTE — Progress Notes (Signed)
   CARE MANAGEMENT NOTE 02/15/2012  Patient:  [name] [name], TISCHER   Account Number:  [id number]  Date Initiated:  02/15/2012  Documentation initiated by:  Darlyne Russian  Subjective/Objective Assessment:   Patient admitted with TIA     Action/Plan:   Patient lives at home with spouse   Anticipated DC Date:  02/16/2012   Anticipated DC Plan:  HOME/SELF CARE      DC Planning Services  CM consult      Choice offered to / List presented to:             Status of service:  In process, will continue to follow Medicare Important Message given?   (If response is "NO", the following Medicare IM given date fields will be blank) Date Medicare IM given:   Date Additional Medicare IM given:    Discharge Disposition:    Per UR Regulation:    If discussed at Long Length of Stay Meetings, dates discussed:    Comments:  PCP:  Dr Lupe Carney  02/15/2012  2:00 pm Darlyne Russian RN, CCM  Met with patient to discuss discharge planning. She does not anticipate any home care services upon discharge.  CM to continue to follow for discharge needs.

## 2012-02-15 NOTE — Progress Notes (Signed)
Patient [name] [name], 36 year old female has struggled with asthma and other health challenges.  She enjoys the emotional support of her husband and her 48 year old son.  Patient and husband are natives of [place], [place].  They appreciate the slow pace of life in [place].  Patient and husband thanked Orthoptist for providing pastoral presence, prayer, and conversation. No follow-up needed.

## 2012-02-15 NOTE — Progress Notes (Signed)
  Echocardiogram 2D Echocardiogram has been performed.  Mercy Moore 02/15/2012, 11:57 AM

## 2012-02-15 NOTE — Progress Notes (Addendum)
Subjective: 36 year old CF that presented with Left sided weakness.  Reportedly has resolved.  Patient has no complaints this morning.  Objective: Filed Vitals:   02/14/12 2122 02/14/12 2305 02/15/12 0212 02/15/12 0507  BP: 115/67 103/62 95/49 100/55  Pulse:  65 63 56  Temp:  98.1 F (36.7 C) 98.3 F (36.8 C) 98 F (36.7 C)  TempSrc:  Oral Oral Oral  Resp: 16 16 16 16   Height:  5\' 2"  (1.575 m)    Weight:  56.6 kg (124 lb 12.5 oz)    SpO2: 99% 97% 98% 99%   Weight change:  No intake or output data in the 24 hours ending 02/15/12 0928  General: Alert, awake, oriented x3, in no acute distress.  HEENT: No bruits, no goiter.  Heart: Regular rate and rhythm, without murmurs, rubs, gallops.  Lungs: Clear to auscultation. No wheezes Abdomen: Soft, nontender, nondistended, positive bowel sounds.  Neuro: Patient answers questions appropriately.  Grip strength is equal BL.  5/5 Upper extremity strength on extension/flexion.    Lab Results:  [facility] 02/14/12 2050 02/14/12 2031  NA 140 137  K 3.4* 3.4*  CL 104 102  CO2 -- 22  GLUCOSE 110* 105*  BUN 19 18  CREATININE 0.90 0.78  CALCIUM -- 9.4  MG -- --  PHOS -- --    Basename 02/14/12 2031  AST 19  ALT 16  ALKPHOS 47  BILITOT 0.5  PROT 6.4  ALBUMIN 3.9   No results found for this basename: LIPASE:2,AMYLASE:2 in the last 72 hours  Basename 02/14/12 2050 02/14/12 2031  WBC -- 8.5  NEUTROABS -- 4.5  HGB 12.6 12.0  HCT 37.0 36.7  MCV -- 80.8  PLT -- 190    Basename 02/15/12 0425 02/14/12 2230 02/14/12 2033  CKTOTAL 74 79 82  CKMB 1.3 1.6 1.6  CKMBINDEX -- -- --  TROPONINI <0.30 <0.30 <0.30   No components found with this basename: POCBNP:3 No results found for this basename: DDIMER:2 in the last 72 hours No results found for this basename: HGBA1C:2 in the last 72 hours  Basename 02/15/12 0425  CHOL 136  HDL 53  LDLCALC 72  TRIG 54  CHOLHDL 2.6  LDLDIRECT --   No results found for this basename:  TSH,T4TOTAL,FREET3,T3FREE,THYROIDAB in the last 72 hours No results found for this basename: VITAMINB12:2,FOLATE:2,FERRITIN:2,TIBC:2,IRON:2,RETICCTPCT:2 in the last 72 hours  Micro Results: No results found for this or any previous visit (from the past 240 hour(s)).  Studies/Results: Ct Angio Head W/cm &/or Wo Cm  02/15/2012  *RADIOLOGY REPORT*  Clinical Data:  Sudden onset of left face, arm, and leg numbness as well as mild weakness involving the left arm and leg, now resolving. Suspected TIA.  CT ANGIOGRAPHY HEAD AND NECK  Technique:  Multidetector CT imaging of the head and neck was performed using the standard protocol during bolus administration of intravenous contrast.  Multiplanar CT image reconstructions including MIPs were obtained to evaluate the vascular anatomy. Carotid stenosis measurements (when applicable) are obtained utilizing NASCET criteria, using the distal internal carotid diameter as the denominator.  Contrast: 50mL OMNIPAQUE IOHEXOL 350 MG/ML IV SOLN  Comparison:  CT head earlier in the day (negative).  CTA NECK  Findings:   There is conventional branching of the great vessels from the arch.  There is no extracranial carotid or vertebral stenosis or irregularity.  No evidence for fibromuscular dysplasia or dissection is seen.  No neck masses are appreciated.  The cervical spine appears grossly unremarkable.  There is no pneumothorax or other pathology at the lung apices.   Review of the MIP images confirms the above findings.  IMPRESSION: Negative.  CTA HEAD  Findings:  The skull base, cavernous, and supraclinoid internal carotid arteries are normal.  There is no stenosis of the anterior, middle, or posterior cerebral arteries.  Both vertebrals contribute to the formation of the basilar, with the left slightly dominant.   No cerebellar branch occlusion is seen.  There is no visible intracranial aneurysm or vascular malformation.  Post infusion, there is no abnormal enhancement of the  brain.  The major dural venous sinuses appear patent.  The calvarium is intact.  Chronic sinus disease is redemonstrated.   Review of the MIP images confirms the above findings.  IMPRESSION:  Negative CT angiography of the head.  A preliminary report of these findings was generated shortly after completion of the study for the requesting MD.  Original Report Authenticated By: Elsie Stain, M.D.   Ct Head Wo Contrast  02/14/2012   *RADIOLOGY REPORT*  Clinical Data:  Left side weakness.  CT HEAD WITHOUT CONTRAST  Technique: Contiguous axial images were obtained from the base of the skull through the vertex without intravenous contrast.  Comparison:   None.  Findings:  The brain appears normal without evidence of acute infarction, hemorrhage, mass lesion, mass effect, midline shift or abnormal extra-axial fluid collection.  There is no hydrocephalus or pneumocephalus.  Minimal ethmoid air cell disease is noted. Calvarium intact.  IMPRESSION: No acute finding.  Findings called to Dr. Fonnie Jarvis at the time of interpretation.  Original Report Authenticated By: Bernadene Bell. Maricela Curet, M.D.   Ct Angio Neck W/cm &/or Wo/cm  02/15/2012  *RADIOLOGY REPORT*  Clinical Data:  Sudden onset of left face, arm, and leg numbness as well as mild weakness involving the left arm and leg, now resolving. Suspected TIA.  CT ANGIOGRAPHY HEAD AND NECK  Technique:  Multidetector CT imaging of the head and neck was performed using the standard protocol during bolus administration of intravenous contrast.  Multiplanar CT image reconstructions including MIPs were obtained to evaluate the vascular anatomy. Carotid stenosis measurements (when applicable) are obtained utilizing NASCET criteria, using the distal internal carotid diameter as the denominator.  Contrast: 50mL OMNIPAQUE IOHEXOL 350 MG/ML IV SOLN  Comparison:  CT head earlier in the day (negative).  CTA NECK  Findings:   There is conventional branching of the great vessels from the  arch.  There is no extracranial carotid or vertebral stenosis or irregularity.  No evidence for fibromuscular dysplasia or dissection is seen.  No neck masses are appreciated.  The cervical spine appears grossly unremarkable.  There is no pneumothorax or other pathology at the lung apices.   Review of the MIP images confirms the above findings.  IMPRESSION: Negative.  CTA HEAD  Findings:  The skull base, cavernous, and supraclinoid internal carotid arteries are normal.  There is no stenosis of the anterior, middle, or posterior cerebral arteries.  Both vertebrals contribute to the formation of the basilar, with the left slightly dominant.   No cerebellar branch occlusion is seen.  There is no visible intracranial aneurysm or vascular malformation.  Post infusion, there is no abnormal enhancement of the brain.  The major dural venous sinuses appear patent.  The calvarium is intact.  Chronic sinus disease is redemonstrated.   Review of the MIP images confirms the above findings.  IMPRESSION:  Negative CT angiography of the head.  A preliminary report of  these findings was generated shortly after completion of the study for the requesting MD.  Original Report Authenticated By: Elsie Stain, M.D.   Dg Chest Portable 1 View  02/15/2012  *RADIOLOGY REPORT*  Clinical Data: 36 year old female with chest pain and TIA.  PORTABLE CHEST - 1 VIEW  Comparison: None  Findings: The cardiomediastinal silhouette is unremarkable. The lungs are clear. There is no evidence of focal airspace disease, pulmonary edema, suspicious pulmonary nodule/mass, pleural effusion, or pneumothorax. No acute bony abnormalities are identified.  IMPRESSION: No evidence of active cardiopulmonary disease.  Original Report Authenticated By: Rosendo Gros, M.D.    Medications: I have reviewed the patient's current medications.   Patient Active Hospital Problem List: TIA (transient ischemic attack) (02/14/2012) Initial work up negative.  Awaiting  further tests at this point.  Patient is asymptomatic reportedly.  Will follow up with neurology's recommendations.  Asthma () Stable currently.  No exacerbations  Chest pain (02/14/2012) Resolved.  May have been anxiety from recent complaints.  Her troponin initially was negative and her initial chest complaint sounds atypical.  Pt is currently on Tele with no reported abnormal rates.  Hyperlipidemia (02/14/2012) All values within normal limits and LDL is 72.  Thus will remove this from the problem list.  GERD (gastroesophageal reflux disease) (02/14/2012) Currently with no complaints.  Will continue to monitor and treat as needed.  Disposition:  Per neurology pending results of tests.   LOS: 1 day   Penny Pia M.D.  [facility] 02/15/2012, 9:28 AM  Addendum:  Was paged by nurse that patient was complaining of chest discomfort.  I will order EKG, patient has received aspirin 325 today, Morphine for sever pain with holding parameters based on blood pressure, nitroglycerin for chest pain.  Last 2 cardiac enzymes have been negative.  Awaiting third set.  Will consider obtaining cardiology consult should chest pain persist.  Consideration for prinzmetal although unlikely

## 2012-02-15 NOTE — Discharge Instructions (Signed)
STROKE/TIA DISCHARGE INSTRUCTIONS SMOKING Cigarette smoking nearly doubles your risk of having a stroke & is the single most alterable risk factor  If you smoke or have smoked in the last 12 months, you are advised to quit smoking for your health.  Most of the excess cardiovascular risk related to smoking disappears within a year of stopping.  Ask you doctor about anti-smoking medications  [place] Quit Line: 1-800-QUIT NOW  Free Smoking Cessation Classes [phone]  CHOLESTEROL Know your levels; limit fat & cholesterol in your diet  Lipid Panel     Component Value Date/Time   CHOL 136 02/15/2012 0425   TRIG 54 02/15/2012 0425   HDL 53 02/15/2012 0425   CHOLHDL 2.6 02/15/2012 0425   VLDL 11 02/15/2012 0425   LDLCALC 72 02/15/2012 0425      Many patients benefit from treatment even if their cholesterol is at goal.  Goal: Total Cholesterol (CHOL) less than 160  Goal:  Triglycerides (TRIG) less than 150  Goal:  HDL greater than 40  Goal:  LDL (LDLCALC) less than 100   BLOOD PRESSURE American Stroke Association blood pressure target is less that 120/80 mm/Hg  Your discharge blood pressure is:  BP: 100/55 mmHg  Monitor your blood pressure  Limit your salt and alcohol intake  Many individuals will require more than one medication for high blood pressure  DIABETES (A1c is a blood sugar average for last 3 months) Goal HGBA1c is under 7% (HBGA1c is blood sugar average for last 3 months)  Diabetes: {STROKE DC DIABETES:22357}    No results found for this basename: HGBA1C     Your HGBA1c can be lowered with medications, healthy diet, and exercise.  Check your blood sugar as directed by your physician  Call your physician if you experience unexplained or low blood sugars.  PHYSICAL ACTIVITY/REHABILITATION Goal is 30 minutes at least 4 days per week    {STROKE DC ACTIVITY/REHAB:22359}  Activity decreases your risk of heart attack and stroke and makes your heart stronger.  It helps  control your weight and blood pressure; helps you relax and can improve your mood.  Participate in a regular exercise program.  Talk with your doctor about the best form of exercise for you (dancing, walking, swimming, cycling).  DIET/WEIGHT Goal is to maintain a healthy weight  Your discharge diet is: Cardiac *** liquids Your height is:  Height: 5\' 2"  (157.5 cm) Your current weight is: Weight: 56.6 kg (124 lb 12.5 oz) Your Body Mass Index (BMI) is:  BMI (Calculated): 22.9   Following the type of diet specifically designed for you will help prevent another stroke.  Your goal weight range is:  ***  Your goal Body Mass Index (BMI) is 19-24.  Healthy food habits can help reduce 3 risk factors for stroke:  High cholesterol, hypertension, and excess weight.  RESOURCES Stroke/Support Group:  Call [phone]  they meet the 3rd Sunday of the month on the Rehab Unit at [facility], [place] ( no meetings June, July & Aug).  STROKE EDUCATION PROVIDED/REVIEWED AND GIVEN TO PATIENT Stroke warning signs and symptoms How to activate emergency medical system (call 911). Medications prescribed at discharge. Need for follow-up after discharge. Personal risk factors for stroke. Pneumonia vaccine given:   {STROKE DC YES/NO/DATE:22363} Flu vaccine given:   {STROKE DC YES/NO/DATE:22363} My questions have been answered, the writing is legible, and I understand these instructions.  I will adhere to these goals & educational materials that have been provided to me  after my discharge from the hospital.

## 2012-02-15 NOTE — Progress Notes (Signed)
Pt. C/o CP again; states that it started "after meds were given" rates pain 5 out of 10 on left chest. O2 started @ 2L. Dr. Cena Benton paged. Orders received. Nitro and Morphine held secondary to SBP in 90s. Dr. Cena Benton notified of this and EKG results. Pt. States pain decreasing. Will continue to monitor.

## 2012-02-15 NOTE — Progress Notes (Signed)
Stroke Team Progress Note  HISTORY [name] [name] is an 36 y.o. female with a history of asthma and GERD presenting 02/15/2012 with sudden onset of numbness involving left face arm and leg as well as mild weakness involving left arm and leg at about 1930 this evening while eating dinner. She is no previous history of stroke nor TIA. He was also no history of migraine headaches. Patient has not experienced a headache with current symptoms. She also has not experienced changes in cognitive functioning. She's had no problems with speech no swallowing. She has not been on antiplatelet therapy. CT scan of her head showed no acute intracranial abnormality. CT of the head and neck with contrast was completed. Results are pending. NIH stroke score was 3. Patient's symptoms were resolving. She was not considered a candidate for intravenous thrombolytic therapy with TPA due to resolving symptoms. She was admitted for further evaluation and treatment.  SUBJECTIVE Her husband is at the bedside. Overall she feels her condition is stable. Husband reports increased amt of stress at home; did not give further details.  OBJECTIVE Filed Vitals:   02/14/12 2305 02/15/12 0212 02/15/12 0507 02/15/12 0954  BP: 103/62 95/49 100/55 99/64  Pulse: 65 63 56 62  Temp: 98.1 F (36.7 C) 98.3 F (36.8 C) 98 F (36.7 C) 98.4 F (36.9 C)  TempSrc: Oral Oral Oral Oral  Resp: 16 16 16 16   Height: 5\' 2"  (1.575 m)     Weight: 56.6 kg (124 lb 12.5 oz)     SpO2: 97% 98% 99% 96%   CBG (last 3)  Basename 02/15/12 0700 02/14/12 2021  GLUCAP 83 134*   Intake/Output from previous day:   IV Fluid Intake:     . sodium chloride     Medications    . aspirin      . aspirin  325 mg Oral Daily  . enoxaparin  40 mg Subcutaneous Q24H  . fluticasone  1 spray Each Nare Daily  . pneumococcal 23 valent vaccine  0.5 mL Intramuscular Tomorrow-1000  . potassium chloride  10 mEq Intravenous Q1 Hr x 2  . DISCONTD: nitroGLYCERIN  0.3 mg  Sublingual Once  PRN iohexol, levalbuterol, morphine injection, DISCONTD:  HYDROmorphone (DILAUDID) injection  Diet:  Cardiac thin liquids Activity:  Bathroom privileges DVT Prophylaxis:  Lovenox 40 mg sq daily   Significant Diagnostic Studies: CBC    Component Value Date/Time   WBC 8.5 02/14/2012 2031   RBC 4.54 02/14/2012 2031   HGB 12.6 02/14/2012 2050   HCT 37.0 02/14/2012 2050   PLT 190 02/14/2012 2031   MCV 80.8 02/14/2012 2031   MCH 26.4 02/14/2012 2031   MCHC 32.7 02/14/2012 2031   RDW 12.7 02/14/2012 2031   LYMPHSABS 3.1 02/14/2012 2031   MONOABS 0.6 02/14/2012 2031   EOSABS 0.3 02/14/2012 2031   BASOSABS 0.0 02/14/2012 2031   CMP    Component Value Date/Time   NA 140 02/14/2012 2050   K 3.4* 02/14/2012 2050   CL 104 02/14/2012 2050   CO2 22 02/14/2012 2031   GLUCOSE 110* 02/14/2012 2050   BUN 19 02/14/2012 2050   CREATININE 0.90 02/14/2012 2050   CALCIUM 9.4 02/14/2012 2031   PROT 6.4 02/14/2012 2031   ALBUMIN 3.9 02/14/2012 2031   AST 19 02/14/2012 2031   ALT 16 02/14/2012 2031   ALKPHOS 47 02/14/2012 2031   BILITOT 0.5 02/14/2012 2031   GFRNONAA >90 02/14/2012 2031   GFRAA >90 02/14/2012 2031   COAGS Lab  Results  Component Value Date   INR 1.08 02/14/2012   Lipid Panel    Component Value Date/Time   CHOL 136 02/15/2012 0425   TRIG 54 02/15/2012 0425   HDL 53 02/15/2012 0425   CHOLHDL 2.6 02/15/2012 0425   VLDL 11 02/15/2012 0425   LDLCALC 72 02/15/2012 0425   HgbA1C  No results found for this basename: HGBA1C   Urine Drug Screen    Component Value Date/Time   LABOPIA NONE DETECTED 02/15/2012 0436   COCAINSCRNUR NONE DETECTED 02/15/2012 0436   LABBENZ NONE DETECTED 02/15/2012 0436   AMPHETMU NONE DETECTED 02/15/2012 0436   THCU NONE DETECTED 02/15/2012 0436   LABBARB NONE DETECTED 02/15/2012 0436    Alcohol Level No results found for this basename: eth   CT of the brain  No acute finding.  CT angio head  Negative CT angiography of the head.   CT angio neck   Negative  MRI of the brain  pending  MRA of the brain  See angio  2D Echocardiogram  underway  Carotid Doppler  See angio  CXR   No evidence of active cardiopulmonary disease.   Physical Exam   Frail young anxious Caucasian lady not in distress. Afebrile. Head is nontraumatic. Neck is supple. Cardiac exam no murmur or gallop. Lungs clear to auscultation. Distal pulses are well felt. Neurological exam. Awake  Alert oriented x 3.Anxious. Normal speech and language.eye movements full without nystagmus. Face symmetric. Tongue midline. Normal strength, tone, reflexes and coordination. Normal sensation. Gait deferred.  ASSESSMENT Ms. [name] [name] is a 36 y.o. female with sudden onset left face/arm/leg numbness as well as weak, dizzy, CP and palpitations. Admitted for stroke workup. Work up thus far is neg. Concerned sx, secondary to stress as husband reports lots of stress at home. Not on antiplatelets prior admission. Now on aspirin 325 mg orally every day for secondary stroke prevention. Patient with resultant symptoms resolved.   Hospital day # 1  TREATMENT/PLAN - Continue aspirin 325 mg orally every day for secondary stroke prevention for now. If workup negative for stroke, no antiplatelet recommended - f/u MRI, 2D -D/w patient and husband. Joaquin Music, ANP-BC, GNP-BC [facility] Pager: [phone] 02/15/2012 10:55 AM  Dr. Delia Heady, Stroke Center Medical Director, has personally reviewed chart, pertinent data, examined the patient and developed the plan of care.

## 2012-02-15 NOTE — Progress Notes (Signed)
This RN and the 3rd shift RN both spoke with MRI techs this am and it was decided that the patient would go down and be monitored by the radiology RNs since pt. cannot transport off monitor. When this RN called the MRI tech to ask why the pt. had not been able to get her MRI today the MRI tech stated that "the radiology nurses left early today." Pt. will therefore receive MRI on Tuesday morning.

## 2012-02-16 ENCOUNTER — Observation Stay (HOSPITAL_COMMUNITY): Payer: 59

## 2012-02-16 MED ORDER — LORAZEPAM 2 MG/ML IJ SOLN: 1.0000 mg | Freq: Once | INTRAMUSCULAR | Status: DC | PRN

## 2012-02-16 MED ORDER — LORAZEPAM 2 MG/ML IJ SOLN
INTRAMUSCULAR | Status: AC
  Filled 2012-02-16: qty 1

## 2012-02-16 MED ORDER — LORAZEPAM BOLUS VIA INFUSION: 1.0000 mg | INTRAVENOUS | Status: DC | PRN

## 2012-02-16 NOTE — Progress Notes (Signed)
Subjective: Interim History: [name] [name] is an 36 y.o. female with a history of asthma and GERD presenting 02/15/2012 with sudden onset of numbness involving left face arm and leg as well as mild weakness involving left arm and leg at about 1930 this evening while eating dinner. She is no previous history of stroke nor TIA. He was also no history of migraine headaches. Patient has not experienced a headache with current symptoms. She also has not experienced changes in cognitive functioning. She's had no problems with speech no swallowing. She has not been on antiplatelet therapy. CT scan of her head showed no acute intracranial abnormality. CT of the head and neck with contrast was completed. Results are pending. NIH stroke score was 3. Patient's symptoms were resolving. She was not considered a candidate for intravenous thrombolytic therapy with TPA due to resolving symptoms. She was admitted for further evaluation and treatment.   This morning patient denies any new focal neurological findings.  No acute complaints currently.   Objective: Filed Vitals:   02/15/12 1818 02/15/12 2200 02/16/12 0145 02/16/12 0600  BP: 95/62 96/63 81/41  102/65  Pulse: 66 64 68 70  Temp: 98.4 F (36.9 C) 98.3 F (36.8 C) 98.2 F (36.8 C) 98.3 F (36.8 C)  TempSrc: Oral     Resp: 18 16 16 16   Height:      Weight:      SpO2: 94% 97% 96% 97%   Weight change:   Intake/Output Summary (Last 24 hours) at 02/16/12 0911 Last data filed at 02/16/12 0700  Gross per 24 hour  Intake   1660 ml  Output      0 ml  Net   1660 ml    General: Alert, awake, oriented x3, in no acute distress.  HEENT: No bruits, no goiter.  Heart: Regular rate and rhythm, without murmurs, rubs, gallops.  Lungs: Clear to auscultation, no wheezes Abdomen: Soft, nontender, nondistended, positive bowel sounds.  Neuro: Grossly intact, nonfocal.   Lab Results:  Basename 02/14/12 2050 02/14/12 2031  NA 140 137  K 3.4* 3.4*  CL 104 102    CO2 -- 22  GLUCOSE 110* 105*  BUN 19 18  CREATININE 0.90 0.78  CALCIUM -- 9.4  MG -- --  PHOS -- --    Basename 02/14/12 2031  AST 19  ALT 16  ALKPHOS 47  BILITOT 0.5  PROT 6.4  ALBUMIN 3.9   No results found for this basename: LIPASE:2,AMYLASE:2 in the last 72 hours  Basename 02/14/12 2050 02/14/12 2031  WBC -- 8.5  NEUTROABS -- 4.5  HGB 12.6 12.0  HCT 37.0 36.7  MCV -- 80.8  PLT -- 190    Basename 02/15/12 1425 02/15/12 0425 02/14/12 2230  CKTOTAL 55 74 79  CKMB 1.0 1.3 1.6  CKMBINDEX -- -- --  TROPONINI <0.30 <0.30 <0.30   No components found with this basename: POCBNP:3 No results found for this basename: DDIMER:2 in the last 72 hours  Basename 02/15/12 0425  HGBA1C 5.1    Basename 02/15/12 0425  CHOL 136  HDL 53  LDLCALC 72  TRIG 54  CHOLHDL 2.6  LDLDIRECT --   No results found for this basename: TSH,T4TOTAL,FREET3,T3FREE,THYROIDAB in the last 72 hours No results found for this basename: VITAMINB12:2,FOLATE:2,FERRITIN:2,TIBC:2,IRON:2,RETICCTPCT:2 in the last 72 hours  Micro Results: No results found for this or any previous visit (from the past 240 hour(s)).  Studies/Results: Ct Angio Head W/cm &/or Wo Cm  02/15/2012  *RADIOLOGY REPORT*  Clinical Data:  Sudden onset of left face, arm, and leg numbness as well as mild weakness involving the left arm and leg, now resolving. Suspected TIA.  CT ANGIOGRAPHY HEAD AND NECK  Technique:  Multidetector CT imaging of the head and neck was performed using the standard protocol during bolus administration of intravenous contrast.  Multiplanar CT image reconstructions including MIPs were obtained to evaluate the vascular anatomy. Carotid stenosis measurements (when applicable) are obtained utilizing NASCET criteria, using the distal internal carotid diameter as the denominator.  Contrast: 50mL OMNIPAQUE IOHEXOL 350 MG/ML IV SOLN  Comparison:  CT head earlier in the day (negative).  CTA NECK  Findings:   There is  conventional branching of the great vessels from the arch.  There is no extracranial carotid or vertebral stenosis or irregularity.  No evidence for fibromuscular dysplasia or dissection is seen.  No neck masses are appreciated.  The cervical spine appears grossly unremarkable.  There is no pneumothorax or other pathology at the lung apices.   Review of the MIP images confirms the above findings.  IMPRESSION: Negative.  CTA HEAD  Findings:  The skull base, cavernous, and supraclinoid internal carotid arteries are normal.  There is no stenosis of the anterior, middle, or posterior cerebral arteries.  Both vertebrals contribute to the formation of the basilar, with the left slightly dominant.   No cerebellar branch occlusion is seen.  There is no visible intracranial aneurysm or vascular malformation.  Post infusion, there is no abnormal enhancement of the brain.  The major dural venous sinuses appear patent.  The calvarium is intact.  Chronic sinus disease is redemonstrated.   Review of the MIP images confirms the above findings.  IMPRESSION:  Negative CT angiography of the head.  A preliminary report of these findings was generated shortly after completion of the study for the requesting MD.  Original Report Authenticated By: Elsie Stain, M.D.   Ct Head Wo Contrast  02/14/2012   *RADIOLOGY REPORT*  Clinical Data:  Left side weakness.  CT HEAD WITHOUT CONTRAST  Technique: Contiguous axial images were obtained from the base of the skull through the vertex without intravenous contrast.  Comparison:   None.  Findings:  The brain appears normal without evidence of acute infarction, hemorrhage, mass lesion, mass effect, midline shift or abnormal extra-axial fluid collection.  There is no hydrocephalus or pneumocephalus.  Minimal ethmoid air cell disease is noted. Calvarium intact.  IMPRESSION: No acute finding.  Findings called to Dr. Fonnie Jarvis at the time of interpretation.  Original Report Authenticated By: Bernadene Bell.  Maricela Curet, M.D.   Ct Angio Neck W/cm &/or Wo/cm  02/15/2012  *RADIOLOGY REPORT*  Clinical Data:  Sudden onset of left face, arm, and leg numbness as well as mild weakness involving the left arm and leg, now resolving. Suspected TIA.  CT ANGIOGRAPHY HEAD AND NECK  Technique:  Multidetector CT imaging of the head and neck was performed using the standard protocol during bolus administration of intravenous contrast.  Multiplanar CT image reconstructions including MIPs were obtained to evaluate the vascular anatomy. Carotid stenosis measurements (when applicable) are obtained utilizing NASCET criteria, using the distal internal carotid diameter as the denominator.  Contrast: 50mL OMNIPAQUE IOHEXOL 350 MG/ML IV SOLN  Comparison:  CT head earlier in the day (negative).  CTA NECK  Findings:   There is conventional branching of the great vessels from the arch.  There is no extracranial carotid or vertebral stenosis or irregularity.  No evidence for fibromuscular dysplasia or dissection is  seen.  No neck masses are appreciated.  The cervical spine appears grossly unremarkable.  There is no pneumothorax or other pathology at the lung apices.   Review of the MIP images confirms the above findings.  IMPRESSION: Negative.  CTA HEAD  Findings:  The skull base, cavernous, and supraclinoid internal carotid arteries are normal.  There is no stenosis of the anterior, middle, or posterior cerebral arteries.  Both vertebrals contribute to the formation of the basilar, with the left slightly dominant.   No cerebellar branch occlusion is seen.  There is no visible intracranial aneurysm or vascular malformation.  Post infusion, there is no abnormal enhancement of the brain.  The major dural venous sinuses appear patent.  The calvarium is intact.  Chronic sinus disease is redemonstrated.   Review of the MIP images confirms the above findings.  IMPRESSION:  Negative CT angiography of the head.  A preliminary report of these findings was  generated shortly after completion of the study for the requesting MD.  Original Report Authenticated By: Elsie Stain, M.D.   Dg Chest Portable 1 View  02/15/2012  *RADIOLOGY REPORT*  Clinical Data: 36 year old female with chest pain and TIA.  PORTABLE CHEST - 1 VIEW  Comparison: None  Findings: The cardiomediastinal silhouette is unremarkable. The lungs are clear. There is no evidence of focal airspace disease, pulmonary edema, suspicious pulmonary nodule/mass, pleural effusion, or pneumothorax. No acute bony abnormalities are identified.  IMPRESSION: No evidence of active cardiopulmonary disease.  Original Report Authenticated By: Rosendo Gros, M.D.    Medications: I have reviewed the patient's current medications.   Patient Active Hospital Problem List: TIA (transient ischemic attack) (02/14/2012)  Per neurology.  Awaiting further imaging results.  Asthma () Stable at this juncture.  Nebulizer prn wheeze or cough  Chest pain (02/14/2012) Resolved spontaneously most likely secondary to anxiety.  Patient has had 3 cardiac enzymes negative.  GERD (gastroesophageal reflux disease) (02/14/2012) Stable currently if patient becomes symptomatic will plan on treating.  Disposition:  Awaiting further diagnostic testing and Neurology's recommendations.     LOS: 2 days   Penny Pia M.D.  [facility] 02/16/2012, 9:11 AM

## 2012-02-16 NOTE — Progress Notes (Signed)
Stroke Team Progress Note  HISTORY [name] [name] is an 36 y.o. female with a history of asthma and GERD presenting 02/15/2012 with sudden onset of numbness involving left face arm and leg as well as mild weakness involving left arm and leg at about 1930 this evening while eating dinner. She is no previous history of stroke nor TIA. He was also no history of migraine headaches. Patient has not experienced a headache with current symptoms. She also has not experienced changes in cognitive functioning. She's had no problems with speech no swallowing. She has not been on antiplatelet therapy. CT scan of her head showed no acute intracranial abnormality. CT of the head and neck with contrast was completed. Results are pending. NIH stroke score was 3. Patient's symptoms were resolving. She was not considered a candidate for intravenous thrombolytic therapy with TPA due to resolving symptoms. She was admitted for further evaluation and treatment.  SUBJECTIVE Her husband is at the bedside. Overall she feels her condition is stable. Just back from MRI. Did not need to take ativan, tolerated MRI ok but study is suboptimal with dental braces artefacts which make DWI images nondiagnostic.Marland Kitchen  OBJECTIVE Filed Vitals:   02/15/12 1818 02/15/12 2200 02/16/12 0145 02/16/12 0600  BP: 95/62 96/63 81/41  102/65  Pulse: 66 64 68 70  Temp: 98.4 F (36.9 C) 98.3 F (36.8 C) 98.2 F (36.8 C) 98.3 F (36.8 C)  TempSrc: Oral     Resp: 18 16 16 16   Height:      Weight:      SpO2: 94% 97% 96% 97%   CBG (last 3)  Basename 02/15/12 1633 02/15/12 1211 02/15/12 0700  GLUCAP 98 92 83   Intake/Output from previous day: 03/25 0701 - 03/26 0700 In: 1660 [P.O.:760; I.V.:900] Out: -  IV Fluid Intake:     . sodium chloride 75 mL/hr at 02/16/12 0700   Medications    . aspirin  325 mg Oral Daily  . enoxaparin  40 mg Subcutaneous Q24H  . fluticasone  1 spray Each Nare Daily  . LORazepam      . nitroGLYCERIN  0.4 mg  Sublingual Once  . pneumococcal 23 valent vaccine  0.5 mL Intramuscular Tomorrow-1000  . DISCONTD: LORazepam  1 mg Intravenous Once  . DISCONTD: LORazepam  1 mg Intravenous On Call  . DISCONTD: nitroGLYCERIN  0.3 mg Sublingual Once  . DISCONTD: nitroGLYCERIN  0.4 mg Sublingual Once  PRN levalbuterol, LORazepam, LORazepam, morphine injection, DISCONTD:  HYDROmorphone (DILAUDID) injection, DISCONTD: LORazepam  Diet:  Cardiac thin liquids Activity:  Bathroom privileges DVT Prophylaxis:  Lovenox 40 mg sq daily   Significant Diagnostic Studies: CBC    Component Value Date/Time   WBC 8.5 02/14/2012 2031   RBC 4.54 02/14/2012 2031   HGB 12.6 02/14/2012 2050   HCT 37.0 02/14/2012 2050   PLT 190 02/14/2012 2031   MCV 80.8 02/14/2012 2031   MCH 26.4 02/14/2012 2031   MCHC 32.7 02/14/2012 2031   RDW 12.7 02/14/2012 2031   LYMPHSABS 3.1 02/14/2012 2031   MONOABS 0.6 02/14/2012 2031   EOSABS 0.3 02/14/2012 2031   BASOSABS 0.0 02/14/2012 2031   CMP    Component Value Date/Time   NA 140 02/14/2012 2050   K 3.4* 02/14/2012 2050   CL 104 02/14/2012 2050   CO2 22 02/14/2012 2031   GLUCOSE 110* 02/14/2012 2050   BUN 19 02/14/2012 2050   CREATININE 0.90 02/14/2012 2050   CALCIUM 9.4 02/14/2012 2031   PROT 6.4  02/14/2012 2031   ALBUMIN 3.9 02/14/2012 2031   AST 19 02/14/2012 2031   ALT 16 02/14/2012 2031   ALKPHOS 47 02/14/2012 2031   BILITOT 0.5 02/14/2012 2031   GFRNONAA >90 02/14/2012 2031   GFRAA >90 02/14/2012 2031   COAGS Lab Results  Component Value Date   INR 1.08 02/14/2012   Lipid Panel    Component Value Date/Time   CHOL 136 02/15/2012 0425   TRIG 54 02/15/2012 0425   HDL 53 02/15/2012 0425   CHOLHDL 2.6 02/15/2012 0425   VLDL 11 02/15/2012 0425   LDLCALC 72 02/15/2012 0425   HgbA1C  Lab Results  Component Value Date   HGBA1C 5.1 02/15/2012   Urine Drug Screen    Component Value Date/Time   LABOPIA NONE DETECTED 02/15/2012 0436   COCAINSCRNUR NONE DETECTED 02/15/2012 0436   LABBENZ NONE  DETECTED 02/15/2012 0436   AMPHETMU NONE DETECTED 02/15/2012 0436   THCU NONE DETECTED 02/15/2012 0436   LABBARB NONE DETECTED 02/15/2012 0436    Alcohol Level No results found for this basename: eth   CT of the brain  No acute finding.  CT angio head  Negative CT angiography of the head.   CT angio neck  Negative  MRI of the brain  Preliminary results suboptimal study due to dental artefacts.DWI images nondiagnostic. No significant abnormality noted on available images.  MRA of the brain  See angio  2D Echocardiogram  EF 55-60% with no source of embolus.   Carotid Doppler  See angio  CXR   02/15/2012 No evidence of active cardiopulmonary disease.   Physical Exam   Frail young anxious Caucasian lady not in distress. Afebrile. Head is nontraumatic. Neck is supple. Cardiac exam no murmur or gallop. Lungs clear to auscultation. Distal pulses are well felt. Neurological exam. Awake  Alert oriented x 3.Anxious. Normal speech and language.eye movements full without nystagmus. Face symmetric. Tongue midline. Normal strength, tone, reflexes and coordination. Normal sensation. Gait deferred.   ASSESSMENT [name] [name] is a 36 y.o. female with sudden onset left face/arm/leg numbness as well as weak, dizzy, CP and palpitations. Admitted for stroke workup. Work up is neg. Concerned sx, secondary to stress as husband reports lots of stress at home. Not on antiplatelets prior admission. Now on aspirin 325 mg orally every day for stroke prevention. Patient with resultant stroke symptoms resolved.   Hospital day # 2  TREATMENT/PLAN - no antiplatelet recommended for stroke prevention given no stroke - Stroke Service will sign off. Follow up with Dr. Pearlean Brownie as needed.  -D/w patient and husband and answered questions.  Joaquin Music, ANP-BC, GNP-BC [facility] Pager: [phone] 02/16/2012 8:28 AM  Dr. Delia Heady, Stroke Center Medical Director, has personally reviewed chart,  pertinent data, examined the patient and developed the plan of care.

## 2012-02-16 NOTE — Discharge Summary (Signed)
[name],[name] Female, 36 y.o., [birth year]  Admit date: 02/14/2012 Discharge date: 02/16/2012  Primary Care Physician:  [name] Stabile, MD, MD   Discharge Diagnoses:   Active Hospital Problems  Diagnoses Date Noted   . TIA (transient ischemic attack) 02/14/2012   . GERD (gastroesophageal reflux disease) 02/14/2012   . Asthma      Resolved Hospital Problems  Diagnoses Date Noted Date Resolved  . Chest pain 02/14/2012 02/16/2012  . Hyperlipidemia 02/14/2012 02/15/2012     DISCHARGE MEDICATION: Medication List  As of 02/16/2012  3:15 PM   TAKE these medications         levalbuterol 45 MCG/ACT inhaler   Commonly known as: XOPENEX HFA   Inhale 1-2 puffs into the lungs every 4 (four) hours as needed. For asthma.      mometasone 50 MCG/ACT nasal spray   Commonly known as: NASONEX   Place 2 sprays into the nose daily.      montelukast 10 MG tablet   Commonly known as: SINGULAIR   Take 10 mg by mouth at bedtime.      mulitivitamin with minerals Tabs   Take 1 tablet by mouth daily.              Consults:     SIGNIFICANT DIAGNOSTIC STUDIES:  Ct Angio Head W/cm &/or Wo Cm  02/15/2012  *RADIOLOGY REPORT*  Clinical Data:  Sudden onset of left face, arm, and leg numbness as well as mild weakness involving the left arm and leg, now resolving. Suspected TIA.  CT ANGIOGRAPHY HEAD AND NECK  Technique:  Multidetector CT imaging of the head and neck was performed using the standard protocol during bolus administration of intravenous contrast.  Multiplanar CT image reconstructions including MIPs were obtained to evaluate the vascular anatomy. Carotid stenosis measurements (when applicable) are obtained utilizing NASCET criteria, using the distal internal carotid diameter as the denominator.  Contrast: 50mL OMNIPAQUE IOHEXOL 350 MG/ML IV SOLN  Comparison:  CT head earlier in the day (negative).  CTA NECK  Findings:   There is conventional branching of the great vessels from the arch.   There is no extracranial carotid or vertebral stenosis or irregularity.  No evidence for fibromuscular dysplasia or dissection is seen.  No neck masses are appreciated.  The cervical spine appears grossly unremarkable.  There is no pneumothorax or other pathology at the lung apices.   Review of the MIP images confirms the above findings.  IMPRESSION: Negative.  CTA HEAD  Findings:  The skull base, cavernous, and supraclinoid internal carotid arteries are normal.  There is no stenosis of the anterior, middle, or posterior cerebral arteries.  Both vertebrals contribute to the formation of the basilar, with the left slightly dominant.   No cerebellar branch occlusion is seen.  There is no visible intracranial aneurysm or vascular malformation.  Post infusion, there is no abnormal enhancement of the brain.  The major dural venous sinuses appear patent.  The calvarium is intact.  Chronic sinus disease is redemonstrated.   Review of the MIP images confirms the above findings.  IMPRESSION:  Negative CT angiography of the head.  A preliminary report of these findings was generated shortly after completion of the study for the requesting MD.  Original Report Authenticated By: Elsie Stain, M.D.   Ct Head Wo Contrast  02/14/2012   *RADIOLOGY REPORT*  Clinical Data:  Left side weakness.  CT HEAD WITHOUT CONTRAST  Technique: Contiguous axial images were obtained from the base of the skull  through the vertex without intravenous contrast.  Comparison:   None.  Findings:  The brain appears normal without evidence of acute infarction, hemorrhage, mass lesion, mass effect, midline shift or abnormal extra-axial fluid collection.  There is no hydrocephalus or pneumocephalus.  Minimal ethmoid air cell disease is noted. Calvarium intact.  IMPRESSION: No acute finding.  Findings called to Dr. Fonnie Jarvis at the time of interpretation.  Original Report Authenticated By: Bernadene Bell. Maricela Curet, M.D.   Ct Angio Neck W/cm &/or  Wo/cm  02/15/2012  *RADIOLOGY REPORT*  Clinical Data:  Sudden onset of left face, arm, and leg numbness as well as mild weakness involving the left arm and leg, now resolving. Suspected TIA.  CT ANGIOGRAPHY HEAD AND NECK  Technique:  Multidetector CT imaging of the head and neck was performed using the standard protocol during bolus administration of intravenous contrast.  Multiplanar CT image reconstructions including MIPs were obtained to evaluate the vascular anatomy. Carotid stenosis measurements (when applicable) are obtained utilizing NASCET criteria, using the distal internal carotid diameter as the denominator.  Contrast: 50mL OMNIPAQUE IOHEXOL 350 MG/ML IV SOLN  Comparison:  CT head earlier in the day (negative).  CTA NECK  Findings:   There is conventional branching of the great vessels from the arch.  There is no extracranial carotid or vertebral stenosis or irregularity.  No evidence for fibromuscular dysplasia or dissection is seen.  No neck masses are appreciated.  The cervical spine appears grossly unremarkable.  There is no pneumothorax or other pathology at the lung apices.   Review of the MIP images confirms the above findings.  IMPRESSION: Negative.  CTA HEAD  Findings:  The skull base, cavernous, and supraclinoid internal carotid arteries are normal.  There is no stenosis of the anterior, middle, or posterior cerebral arteries.  Both vertebrals contribute to the formation of the basilar, with the left slightly dominant.   No cerebellar branch occlusion is seen.  There is no visible intracranial aneurysm or vascular malformation.  Post infusion, there is no abnormal enhancement of the brain.  The major dural venous sinuses appear patent.  The calvarium is intact.  Chronic sinus disease is redemonstrated.   Review of the MIP images confirms the above findings.  IMPRESSION:  Negative CT angiography of the head.  A preliminary report of these findings was generated shortly after completion of the  study for the requesting MD.  Original Report Authenticated By: Elsie Stain, M.D.   Mri Brain Without Contrast  02/16/2012  *RADIOLOGY REPORT*  Clinical Data: 36 year old female with sudden onset of left side weakness.  TIA versus CVA.  MRI HEAD WITHOUT CONTRAST  Technique:  Multiplanar, multiecho pulse sequences of the brain and surrounding structures were obtained according to standard protocol without intravenous contrast.  Comparison: C t a head neck 02/14/2012.  Findings: Susceptibility artifact from dental braces severely affects some sequences of the exam including DWI, T2* and FLAIR. Accommodations were made to the diffusion weighted imaging sequence to try to salvage diffusion weighted imaging.  No restricted diffusion is identified to suggest acute infarction. Major intracranial vascular flow voids are preserved.  No ventriculomegaly. No midline shift, mass effect, or evidence of mass lesion.  Grossly normal pituitary, cervicomedullary junction, and visualized cervical spine.  No definite acute intracranial hemorrhage or extra-axial collection.  Allowing for artifact, Wallace Cullens and white matter signal is within normal limits throughout the brain.  Grossly normal orbits.  Mastoids and internal auditory structures appear within normal limits.  Negative visualized scalp  soft tissues.  IMPRESSION: Negative noncontrast MRI appearance the brain allowing for artifact from dental braces.  Original Report Authenticated By: Harley Hallmark, M.D.   Dg Chest Portable 1 View  02/15/2012  *RADIOLOGY REPORT*  Clinical Data: 36 year old female with chest pain and TIA.  PORTABLE CHEST - 1 VIEW  Comparison: None  Findings: The cardiomediastinal silhouette is unremarkable. The lungs are clear. There is no evidence of focal airspace disease, pulmonary edema, suspicious pulmonary nodule/mass, pleural effusion, or pneumothorax. No acute bony abnormalities are identified.  IMPRESSION: No evidence of active cardiopulmonary  disease.  Original Report Authenticated By: Rosendo Gros, M.D.     ECHO: Study Conclusions 02/15/12  Left ventricle: The cavity size was normal. Systolic function was normal. The estimated ejection fraction was in the range of 55% to 65%. Wall motion was normal; there were no regional wall motion abnormalities. Left ventricular diastolic function parameters were normal.  Impressions:  - No cardiac source of emboli was indentified.   BRIEF ADMITTING H & P:   [name] [name] is an 36 y.o. female who presents to the ED with complaints of sudden onset weakness and numbness of the left side of her body at 7:30 pm. She denied having headache but did report having chest pain.  Neurology was consulted and patient had work up which included MRI, CT head, Ct of neck, CT angiography of head and neck which were all negative.  Asthma () Stable at this juncture. Nebulizer prn wheeze or cough   Chest pain (02/14/2012) Resolved spontaneously most likely secondary to anxiety vs reflux given history. Patient has had 3 cardiac enzymes negative.   GERD (gastroesophageal reflux disease) (02/14/2012) Stable while in house.  Patient should follow up with primary care physician if new complaints arise.   Active Hospital Problems  Diagnoses Date Noted   . TIA (transient ischemic attack) 02/14/2012   . GERD (gastroesophageal reflux disease) 02/14/2012   . Asthma      Resolved Hospital Problems  Diagnoses Date Noted Date Resolved  . Chest pain 02/14/2012 02/16/2012  . Hyperlipidemia 02/14/2012 02/15/2012     Disposition and Follow-up: as indicated below. Discharge Orders    Future Orders Please Complete By Expires   Diet - low sodium heart healthy      Increase activity slowly      Discharge instructions      Comments:   Please follow up with your primary care physician in 1-2 weeks or sooner should there be any new concerns.   Call MD for:  persistant nausea and vomiting      Call MD for:   temperature >100.4      Call MD for:  persistant dizziness or light-headedness        Follow-up Information    Follow up with [name] Stabile, MD .          DISCHARGE EXAM:  General: Alert, awake, oriented x3, in no acute distress. HEENT: No bruits, no goiter. Heart: Regular rate and rhythm, without murmurs, rubs, gallops. Lungs: Clear to auscultation bilaterally. Abdomen: Soft, nontender, nondistended, positive bowel sounds. Extremities: No clubbing cyanosis or edema with positive pedal pulses. Neuro: Grossly intact, nonfocal.   Blood pressure 95/63, pulse 58, temperature 98.6 F (37 C), temperature source Oral, resp. rate 16, height 5\' 2"  (1.575 m), weight 56.6 kg (124 lb 12.5 oz), last menstrual period 01/31/2012, SpO2 98.00%.   Basename 02/14/12 2050 02/14/12 2031  NA 140 137  K 3.4* 3.4*  CL 104 102  CO2 -- 22  GLUCOSE 110* 105*  BUN 19 18  CREATININE 0.90 0.78  CALCIUM -- 9.4  MG -- --  PHOS -- --    Basename 02/14/12 2031  AST 19  ALT 16  ALKPHOS 47  BILITOT 0.5  PROT 6.4  ALBUMIN 3.9   No results found for this basename: LIPASE:2,AMYLASE:2 in the last 72 hours  Basename 02/14/12 2050 02/14/12 2031  WBC -- 8.5  NEUTROABS -- 4.5  HGB 12.6 12.0  HCT 37.0 36.7  MCV -- 80.8  PLT -- 190    Signed: Penny [name] M.D. 02/16/2012, 3:15 PM

## 2012-02-16 NOTE — Progress Notes (Signed)
Utilization review complete 

## 2012-02-16 NOTE — Progress Notes (Signed)
Pt d/c to home by car with family. Assessment stable.  

## 2012-02-18 LAB — GLUCOSE, CAPILLARY: Glucose-Capillary: 97 mg/dL (ref 70–99)

## 2012-02-25 ENCOUNTER — Emergency Department (HOSPITAL_COMMUNITY): Payer: 59

## 2012-02-25 ENCOUNTER — Other Ambulatory Visit: Payer: Self-pay

## 2012-02-25 ENCOUNTER — Encounter (HOSPITAL_COMMUNITY): Payer: Self-pay | Admitting: Emergency Medicine

## 2012-02-25 ENCOUNTER — Emergency Department (HOSPITAL_COMMUNITY)
Admission: EM | Admit: 2012-02-25 | Discharge: 2012-02-25 | Disposition: A | Discharge status: Home / Self Care | Payer: 59 | Attending: Emergency Medicine | Admitting: Emergency Medicine

## 2012-02-25 DIAGNOSIS — M6281 Muscle weakness (generalized): Secondary | ICD-10-CM | POA: Insufficient documentation

## 2012-02-25 DIAGNOSIS — J45909 Unspecified asthma, uncomplicated: Secondary | ICD-10-CM | POA: Insufficient documentation

## 2012-02-25 DIAGNOSIS — R200 Anesthesia of skin: Secondary | ICD-10-CM

## 2012-02-25 DIAGNOSIS — K219 Gastro-esophageal reflux disease without esophagitis: Secondary | ICD-10-CM | POA: Insufficient documentation

## 2012-02-25 DIAGNOSIS — Z79899 Other long term (current) drug therapy: Secondary | ICD-10-CM | POA: Insufficient documentation

## 2012-02-25 DIAGNOSIS — R209 Unspecified disturbances of skin sensation: Secondary | ICD-10-CM | POA: Insufficient documentation

## 2012-02-25 DIAGNOSIS — Z8673 Personal history of transient ischemic attack (TIA), and cerebral infarction without residual deficits: Secondary | ICD-10-CM | POA: Insufficient documentation

## 2012-02-25 HISTORY — DX: Transient cerebral ischemic attack, unspecified: G45.9

## 2012-02-25 LAB — POCT I-STAT, CHEM 8
BUN: 12 mg/dL (ref 6–23)
Calcium, Ion: 1.27 mmol/L (ref 1.12–1.32)
Chloride: 104 meq/L (ref 96–112)
Creatinine, Ser: 0.9 mg/dL (ref 0.50–1.10)
Glucose, Bld: 87 mg/dL (ref 70–99)
HCT: 41 % (ref 36.0–46.0)
Hemoglobin: 13.9 g/dL (ref 12.0–15.0)
Potassium: 3.9 meq/L (ref 3.5–5.1)
Sodium: 141 meq/L (ref 135–145)
TCO2: 25 mmol/L (ref 0–100)

## 2012-02-25 LAB — TROPONIN I: Troponin I: <0.3 ng/mL

## 2012-02-25 LAB — COMPREHENSIVE METABOLIC PANEL WITH GFR
ALT: 14 U/L (ref 0–35)
AST: 17 U/L (ref 0–37)
Albumin: 4.2 g/dL (ref 3.5–5.2)
Alkaline Phosphatase: 49 U/L (ref 39–117)
BUN: 12 mg/dL (ref 6–23)
CO2: 24 meq/L (ref 19–32)
Calcium: 9.8 mg/dL (ref 8.4–10.5)
Chloride: 104 meq/L (ref 96–112)
Creatinine, Ser: 0.76 mg/dL (ref 0.50–1.10)
GFR calc Af Amer: >90 mL/min
GFR calc non Af Amer: >90 mL/min
Glucose, Bld: 82 mg/dL (ref 70–99)
Potassium: 3.9 meq/L (ref 3.5–5.1)
Sodium: 140 meq/L (ref 135–145)
Total Bilirubin: 0.6 mg/dL (ref 0.3–1.2)
Total Protein: 6.9 g/dL (ref 6.0–8.3)

## 2012-02-25 LAB — CBC
HCT: 39.3 % (ref 36.0–46.0)
Hemoglobin: 12.9 g/dL (ref 12.0–15.0)
MCH: 26.4 pg (ref 26.0–34.0)
MCHC: 32.8 g/dL (ref 30.0–36.0)
MCV: 80.4 fL (ref 78.0–100.0)
Platelets: 184 10*3/uL (ref 150–400)
RBC: 4.89 MIL/uL (ref 3.87–5.11)
RDW: 12.5 % (ref 11.5–15.5)
WBC: 7.4 10*3/uL (ref 4.0–10.5)

## 2012-02-25 LAB — CK TOTAL AND CKMB (NOT AT ARMC)
CK, MB: 0.5 ng/mL (ref 0.3–4.0)
Relative Index: INVALID (ref 0.0–2.5)
Total CK: 46 U/L (ref 7–177)

## 2012-02-25 LAB — PROTIME-INR
INR: 1.03 (ref 0.00–1.49)
Prothrombin Time: 13.7 s (ref 11.6–15.2)

## 2012-02-25 LAB — APTT: aPTT: 28 s (ref 24–37)

## 2012-02-25 LAB — GLUCOSE, CAPILLARY: Glucose-Capillary: 85 mg/dL (ref 70–99)

## 2012-02-25 MED ORDER — SODIUM CHLORIDE 0.9 % IJ SOLN: 3.0000 mL | INTRAMUSCULAR | Status: DC

## 2012-02-25 MED ORDER — ASPIRIN 81 MG PO CHEW: 81.0000 mg | CHEWABLE_TABLET | Freq: Every day | ORAL | Status: DC

## 2012-02-25 NOTE — ED Notes (Signed)
Just released from hospital on Monday for TIA

## 2012-02-25 NOTE — ED Notes (Signed)
Was sitting at chair at work and  Started to feel numb on left side progressivly gotten worse got nauseated and dizzy has  A h/a and left arm feels numb and chest feels numb alsi

## 2012-02-25 NOTE — ED Notes (Signed)
Pt d/c home in NAD. Pt ambulated with quick steady gait. Pt voiced understanding of follow up care

## 2012-02-25 NOTE — ED Notes (Signed)
Neuro cancelled code stroke. Pt to be d/c

## 2012-02-25 NOTE — ED Notes (Signed)
Stroke team at bedside with patient.

## 2012-02-25 NOTE — ED Notes (Signed)
Patient is still in ct, will do cbg and ekg on her return.

## 2012-02-25 NOTE — ED Notes (Signed)
Report given to Julie RN.

## 2012-02-25 NOTE — ED Provider Notes (Signed)
History     CSN: [id number]  Arrival date & time 02/25/12  1227   First MD Initiated Contact with Patient 02/25/12 1311      Chief Complaint  Patient presents with  . Weakness    (Consider location/radiation/quality/duration/timing/severity/associated sxs/prior treatment) The history is provided by the patient.   the patient reports acute onset numbness and weakness of her left upper extremity at 12 noon today.  She presented the ER for evaluation.  She was seen last week for similar symptoms.  Workup including MRI which was without abnormality.  She reports her symptoms have now returned.  She has no prior history of stroke.  She is taking her aspirin as she was prescribed.  She used to smoke cigarettes but no longer does.  She's had no difficulties with her speech.  She reports subjective weakness of her left upper extremity as compared to her right which is also new.  She is no headache at this time.  Past Medical History  Diagnosis Date  . Asthma   . GERD (gastroesophageal reflux disease)   . Scoliosis   . TIA (transient ischemic attack)     Past Surgical History  Procedure Date  . Cesarean section 60    boy    Family History  Problem Relation Age of Onset  . Diabetes Father   . Colon cancer Father   . Hypertension Father   . Hyperlipidemia Father   . Cancer Father     prostate  . Cancer Maternal Uncle   . Cancer Maternal Grandfather   . Cancer Paternal Grandmother     stomach  . Cancer Paternal Grandfather     lung    History  Substance Use Topics  . Smoking status: Former Smoker    Quit date: 06/12/2011  . Smokeless tobacco: Never Used  . Alcohol Use: No    OB History    Grav Para Term Preterm Abortions TAB SAB Ect Mult Living   2 1   1 1    1       Review of Systems  All other systems reviewed and are negative.    Allergies  Review of patient's allergies indicates no known allergies.  Home Medications   Current Outpatient Rx  Name Route  Sig Dispense Refill  . ASPIRIN 81 MG PO CHEW Oral Chew 81 mg by mouth daily.    . MOMETASONE FUROATE 50 MCG/ACT NA SUSP Nasal Place 2 sprays into the nose daily.     Marland Kitchen MONTELUKAST SODIUM 10 MG PO TABS Oral Take 10 mg by mouth at bedtime.    . ADULT MULTIVITAMIN W/MINERALS CH Oral Take 1 tablet by mouth daily.    . ASPIRIN 81 MG PO CHEW Oral Chew 1 tablet (81 mg total) by mouth daily. 30 tablet 1  . LEVALBUTEROL TARTRATE 45 MCG/ACT IN AERO Inhalation Inhale 2 puffs into the lungs every 4 (four) hours as needed. For asthma.      BP 95/51  Pulse 76  Temp(Src) 98.3 F (36.8 C) (Oral)  Resp 19  SpO2 98%  LMP 01/31/2012  Physical Exam  Nursing note and vitals reviewed. Constitutional: She is oriented to person, place, and time. She appears well-developed and well-nourished. No distress.  HENT:  Head: Normocephalic and atraumatic.  Eyes: EOM are normal.  Neck: Normal range of motion.  Cardiovascular: Normal rate, regular rhythm and normal heart sounds.   Pulmonary/Chest: Effort normal and breath sounds normal.  Abdominal: Soft. She exhibits no distension. There is  no tenderness.  Musculoskeletal: Normal range of motion.  Neurological: She is alert and oriented to person, place, and time.       5-/5 strength in left upper extremity as compared to 5/5 strength in right upper extremity.  Normal strength in bilateral lower extremities.  Decreased sensation in the left upper extremity.  Finger to nose with her left hand is abnormal  Skin: Skin is warm and dry.  Psychiatric: She has a normal mood and affect. Judgment normal.    ED Course  Procedures (including critical care time)   Labs Reviewed  PROTIME-INR  APTT  COMPREHENSIVE METABOLIC PANEL  CK TOTAL AND CKMB  TROPONIN I  POCT I-STAT, CHEM 8  CBC   Ct Head Wo Contrast  02/25/2012  *RADIOLOGY REPORT*  Clinical Data: Code stroke, some left weakness  CT HEAD WITHOUT CONTRAST  Technique:  Contiguous axial images were obtained from  the base of the skull through the vertex without contrast.  Comparison: 02/14/2012  Findings: No acute intracranial hemorrhage, definite acute infarction, mass lesion, midline shift, herniation, hydrocephalus. Normal gray-white matter differentiation.  No extra-axial fluid. Cisterns patent. No Cerebellar abnormality.  Mastoids clear.  Minor right sphenoid and ethmoid mucosal thickening.  IMPRESSION: No acute intracranial finding.  Critical Value/emergent results were called by telephone at the time of interpretation on 02/25/2012  at 1:30 p.m.  to  Dr. Lu Duffel, who verbally acknowledged these results.  Original Report Authenticated By: Judie Petit. Ruel Favors, M.D.   Mr Brain Wo Contrast  02/25/2012  *RADIOLOGY REPORT*  Clinical Data: Sudden onset of numbness.  Code stroke.  MRI HEAD WITHOUT CONTRAST  Technique:  Multiplanar, multiecho pulse sequences of the brain and surrounding structures were obtained according to standard protocol without intravenous contrast.  Comparison: CT head 02/25/2012.  MRI brain 02/16/2012.  Findings: There is artifact anteriorly due to the patient's braces, as before.  The diffusion weighted images demonstrate no evidence for acute or subacute infarct.  The visualized portions of the brain are unremarkable.  No acute hemorrhage or mass lesion is present.  The study is mildly degraded by patient motion.  Flow is present in the major intracranial arteries.  The paranasal sinuses are obscured by metal artifact on most sequences.  There is moderate to mucosal thickening within the visualized paranasal sinuses.  The mastoid air cells are clear.  IMPRESSION:  1.  Negative MRI of the brain. 2.  Portions of the anterior frontal and temporal lobes are obscured by metal artifact, particularly on the diffusion weighted images. 3.  Moderate sinus disease.  These results were called by telephone on 02/25/2012  at  03:25 p.m. to  Dr. Roseanne Reno, who verbally acknowledged these results.  Original Report  Authenticated By: Jamesetta Orleans. MATTERN, M.D.     1. Numbness       MDM  Acute numbness and mild left-sided weakness.  Could stroke on arrival.  The patient had a large workup approximately one week ago.  This was negative for ischemia.  The patient was seen and evaluated by neurology and after a negative CT labs and MRI scan the patient is deemed a good candidate for going home on an 81 mg aspirin.  No additional workup is required.  She only reports numbness at this time.  My suspicion that this is in her cervical thoracic or lumbar spine is very low  The patient was evaluated by the neurologist the bedside who agreed MRI scan.  MRI obtained demonstrating no evidence of stroke.  DC  home in good condition        Lyanne Co, MD 02/26/12 [phone]

## 2012-02-25 NOTE — Code Documentation (Signed)
36 yo female who was here a few days PTA and had full stroke w/u and discharged with diagnosis of TIA.Marland Kitchen Today while at work, she had numbness of her LUE and face at 1130, and then eventually, 1200, noted dizziness and heart palpitations.  She was brought to the ED by a friend, arriving at 1227. She was seen by the EDP at 1300 and code stroke was called at 1309.  Stroke team arrived at 1310 as pt was rolling into CT.  Blood was drawn by the RN at 1310. Pt is alert and conversant, a good historian, and concerned that sx have re-occurred.Marland Kitchen NIHSS=1 for subjective sensory loss as described above. She does split the mid-line. She denies smoking, BC pills, not recent illnesses or extreme exertion. She does w/o at the gym, but has not participated in exercise activities for 2 weeks. Dr. Roseanne Reno here. Plan is to get DWI.  ED staff and pt aware of plan, and pt is agreeable.

## 2012-02-25 NOTE — ED Notes (Signed)
Pt states she began having numbness and weakness in her left arm starting around 1130.  Pt was released on the 26th of march for tia.

## 2012-02-25 NOTE — ED Notes (Signed)
Pt transported to MRI by transport tech. Neurologist stated pt would not need nurse to accompany pt. NAD noted to pt

## 2012-02-25 NOTE — Consult Note (Signed)
Chief Complaint: Acute recurrent numbness involving left face, arm and leg  HPI: [name] [name] is an 36 y.o. female with a recent hospitalization for workup of TIA with numbness involving left side. She was admitted on 02/14/2012. Workup was negative, including MRI and MRA of the brain as well as carotid Doppler study and echocardiogram. Patient was discharged on aspirin 324 mg per day. She did not take aspirin this morning. She developed acute onset of numbness involving left side at noon today. NIH stroke score today was 1 for numbness. CT scan of her head showed no acute intracranial abnormality. MRI study of the brain showed no acute stroke.  LSN: 1200 today tPA Given: No: Minimal deficits and resolving symptoms MRankin: 0  Past Medical History  Diagnosis Date  . Asthma   . GERD (gastroesophageal reflux disease)   . Scoliosis   . TIA (transient ischemic attack)     Family History  Problem Relation Age of Onset  . Diabetes Father   . Colon cancer Father   . Hypertension Father   . Hyperlipidemia Father   . Cancer Father     prostate  . Cancer Maternal Uncle   . Cancer Maternal Grandfather   . Cancer Paternal Grandmother     stomach  . Cancer Paternal Grandfather     lung     Medications: Prior to Admission:  Aspirin 324 mg per day Nasonex nasal spray Singulair 10 mg per day Multivitamin 1 per day Xopenex HFA 2 puffs every 4 hours when necessary  Physical Examination: Blood pressure 95/51, pulse 76, temperature 98.3 F (36.8 C), temperature source Oral, resp. rate 19, last menstrual period 01/31/2012, SpO2 98.00%.  Neurologic Examination: Mental Status: Alert, oriented, thought content appropriate.  Speech fluent without evidence of aphasia. Able to follow commands without difficulty. Cranial Nerves: II-Visual fields were normal. III/IV/VI-Extraocular movements were full and conjugate.  Pupils reacted normally to light. V/VII-left facial numbness to tactile  stimulation and 2 pinprick; no facial weakness. VIII-grossly intact X-normal speech XII-midline tongue extension Motor: 5/5 bilaterally with normal tone and bulk Sensory: Reduced perception to tactile stimulation including light touch in pinprick over the entire left side of body. Deep Tendon Reflexes: 2+ and symmetric throughout Plantars: Downgoing bilaterally Cerebellar: Normal finger-to-nose and heel-to-shin testing was normal.  Ct Head Wo Contrast  02/25/2012  *RADIOLOGY REPORT*  Clinical Data: Code stroke, some left weakness  CT HEAD WITHOUT CONTRAST  Technique:  Contiguous axial images were obtained from the base of the skull through the vertex without contrast.  Comparison: 02/14/2012  Findings: No acute intracranial hemorrhage, definite acute infarction, mass lesion, midline shift, herniation, hydrocephalus. Normal gray-white matter differentiation.  No extra-axial fluid. Cisterns patent. No Cerebellar abnormality.  Mastoids clear.  Minor right sphenoid and ethmoid mucosal thickening.  IMPRESSION: No acute intracranial finding.  Critical Value/emergent results were called by telephone at the time of interpretation on 02/25/2012  at 1:30 p.m.  to  Dr. Lu Duffel, who verbally acknowledged these results.  Original Report Authenticated By: Judie Petit. Ruel Favors, M.D.   Mr Brain Wo Contrast  02/25/2012  *RADIOLOGY REPORT*  Clinical Data: Sudden onset of numbness.  Code stroke.  MRI HEAD WITHOUT CONTRAST  Technique:  Multiplanar, multiecho pulse sequences of the brain and surrounding structures were obtained according to standard protocol without intravenous contrast.  Comparison: CT head 02/25/2012.  MRI brain 02/16/2012.  Findings: There is artifact anteriorly due to the patient's braces, as before.  The diffusion weighted images demonstrate no evidence for acute  or subacute infarct.  The visualized portions of the brain are unremarkable.  No acute hemorrhage or mass lesion is present.  The study is mildly  degraded by patient motion.  Flow is present in the major intracranial arteries.  The paranasal sinuses are obscured by metal artifact on most sequences.  There is moderate to mucosal thickening within the visualized paranasal sinuses.  The mastoid air cells are clear.  IMPRESSION:  1.  Negative MRI of the brain. 2.  Portions of the anterior frontal and temporal lobes are obscured by metal artifact, particularly on the diffusion weighted images. 3.  Moderate sinus disease.  These results were called by telephone on 02/25/2012  at  03:25 p.m. to  Dr. Roseanne Reno, who verbally acknowledged these results.  Original Report Authenticated By: Jamesetta Orleans. MATTERN, M.D.    Assessment: 36 y.o. 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 Symptoms are resolving with only minimal distal upper extremity sensory changes remaining no clinical signs of stroke nor MRI evidence of acute stroke. Workup for stroke risk was completed less than 2 weeks ago and need not be repeated at this point.  Stroke Risk Factors - recent TIA  Plan: 1. Prophylactic therapy-Antiplatelet med: Aspirin - 81 mg per day 2. No further neurodiagnostic studies are indicated. 3. Followup with primary care physician routine basis.  C.R. Roseanne Reno, MD [facility] [phone]  02/25/2012, 3:57 PM

## 2012-02-25 NOTE — ED Notes (Signed)
Requested MD to come to bedside to evaluate for possible code stroke.

## 2012-03-29 ENCOUNTER — Encounter (INDEPENDENT_AMBULATORY_CARE_PROVIDER_SITE_OTHER): Payer: 59

## 2012-03-29 DIAGNOSIS — R002 Palpitations: Secondary | ICD-10-CM

## 2012-10-03 ENCOUNTER — Ambulatory Visit (INDEPENDENT_AMBULATORY_CARE_PROVIDER_SITE_OTHER): Payer: 59 | Admitting: Gynecology

## 2012-10-03 ENCOUNTER — Encounter: Payer: Self-pay | Admitting: Gynecology

## 2012-10-03 VITALS — BP 102/66 | Ht 62.25 in | Wt 132.0 lb

## 2012-10-03 DIAGNOSIS — Z131 Encounter for screening for diabetes mellitus: Secondary | ICD-10-CM

## 2012-10-03 DIAGNOSIS — Z01419 Encounter for gynecological examination (general) (routine) without abnormal findings: Secondary | ICD-10-CM

## 2012-10-03 DIAGNOSIS — Z13220 Encounter for screening for lipoid disorders: Secondary | ICD-10-CM

## 2012-10-03 DIAGNOSIS — N926 Irregular menstruation, unspecified: Secondary | ICD-10-CM

## 2012-10-03 LAB — LIPID PANEL
Cholesterol: 170 mg/dL (ref 0–200)
HDL: 58 mg/dL (ref >39)
LDL Cholesterol: 99 mg/dL (ref 0–99)
Total CHOL/HDL Ratio: 2.9 Ratio
Triglycerides: 63 mg/dL (ref <150)
VLDL: 13 mg/dL (ref 0–40)

## 2012-10-03 LAB — CBC WITH DIFFERENTIAL/PLATELET
Basophils Absolute: 0 10*3/uL (ref 0.0–0.1)
Basophils Relative: 1 % (ref 0–1)
Eosinophils Absolute: 0.2 10*3/uL (ref 0.0–0.7)
Eosinophils Relative: 2 % (ref 0–5)
HCT: 37.8 % (ref 36.0–46.0)
Hemoglobin: 12.3 g/dL (ref 12.0–15.0)
Lymphocytes Relative: 27 % (ref 12–46)
Lymphs Abs: 2.3 10*3/uL (ref 0.7–4.0)
MCH: 26.3 pg (ref 26.0–34.0)
MCHC: 32.5 g/dL (ref 30.0–36.0)
MCV: 80.8 fL (ref 78.0–100.0)
Monocytes Absolute: 0.5 10*3/uL (ref 0.1–1.0)
Monocytes Relative: 6 % (ref 3–12)
Neutro Abs: 5.6 10*3/uL (ref 1.7–7.7)
Neutrophils Relative %: 64 % (ref 43–77)
Platelets: 209 10*3/uL (ref 150–400)
RBC: 4.68 MIL/uL (ref 3.87–5.11)
RDW: 13.3 % (ref 11.5–15.5)
WBC: 8.6 10*3/uL (ref 4.0–10.5)

## 2012-10-03 LAB — GLUCOSE, RANDOM: Glucose, Bld: 69 mg/dL — ABNORMAL LOW (ref 70–99)

## 2012-10-03 NOTE — Progress Notes (Signed)
[name] [name] 1976-05-01 [id number]        36 y.o.  G2P1011 for annual exam.    Past medical history,surgical history, medications, allergies, family history and social history were all reviewed and documented in the EPIC chart. ROS:  Was performed and pertinent positives and negatives are included in the history.  Exam: Biomedical scientist Filed Vitals:   10/03/12 0928  BP: 102/66  Height: 5' 2.25" (1.581 m)  Weight: 132 lb (59.875 kg)   General appearance  Normal Skin grossly normal Head/Neck normal with no cervical or supraclavicular adenopathy thyroid normal Lungs  clear Cardiac RR, without RMG Abdominal  soft, nontender, without masses, organomegaly or hernia Breasts  examined lying and sitting without masses, retractions, discharge or axillary adenopathy. Pelvic  Ext/BUS/vagina  normal   Cervix  normal   Uterus  anteverted, normal size, shape and contour, midline and mobile nontender   Adnexa  Without masses or tenderness    Anus and perineum  normal   Rectovaginal  normal sphincter tone without palpated masses or tenderness.    Assessment/Plan:  36 y.o. G39P1011 female for annual exam.   1. Infertility. Patient wondering about getting pregnant. Last pregnancy 16 years ago. Not preventing but not overly trying to get pregnant. Menses monthly although vary from 28-35 days. Previously checked TSH prolactin for similar complaints 2011 which were normal. Does feel that she ovulates with change in vaginal discharge. Recommend semen analysis/HSG and ovulation predictor kits with timed intercourse based on these. Check progesterone level today as she is in the luteal phase. Ultimate referral to a reproductive endocrinologist reviewed.  Decreasing fecundity with advancing maternal age discussed a need for more aggressive pursuit of pregnancy if she desires. Multivitamin with folic acid being taken. 2. TIA. Patient lists a TIA but on questioning it sounds to be a migraine variant. She does get  premenstrual migraines and apparently has some neurologic symptoms attributable to vasospasm which resolved. She states that she was evaluated extensively and there was no clot but was felt to be due to her migraine. She is actively being seen by a neurologist. 3. Pap smear. No Pap smear done today. Last Pap smear 2012. No history of abnormal Pap smears. Plan every 3-5 year screening per current screening guidelines. 4. Breast health. SBE monthly reviewed. Screening mammogram recommendations between 35 and 40 discussed. She has no strong family history of her first week closer to 40. 5. Health maintenance. Baseline CBC lipid profile glucose urinalysis ordered. Follow up in one year, sooner with above testing or referral to a reproductive endocrinologist at her choice.   [name] MD, 9:58 AM 10/03/2012

## 2012-10-03 NOTE — Patient Instructions (Signed)
Office will contact you in reference to arranging semen analysis and HSG. Follow up in one year for annual gynecologic exam

## 2012-10-04 ENCOUNTER — Telehealth: Payer: Self-pay | Admitting: *Deleted

## 2012-10-04 LAB — URINALYSIS W MICROSCOPIC + REFLEX CULTURE
Bilirubin Urine: NEGATIVE
Casts: NONE SEEN
Crystals: NONE SEEN
Glucose, UA: NEGATIVE mg/dL
Hgb urine dipstick: NEGATIVE
Leukocytes, UA: NEGATIVE
Nitrite: NEGATIVE
Protein, ur: NEGATIVE mg/dL
Specific Gravity, Urine: 1.024 (ref 1.005–1.030)
Urobilinogen, UA: 0.2 mg/dL (ref 0.0–1.0)
pH: 6.5 (ref 5.0–8.0)

## 2012-10-04 LAB — PROGESTERONE: Progesterone: 12.6 ng/mL

## 2012-10-04 NOTE — Telephone Encounter (Signed)
LM for pt to discuss SA and HSG.

## 2012-10-05 NOTE — Telephone Encounter (Signed)
Spoke to pt. Period due in a couple of days. I told her to call on D1 or as soon as she can so we can set up HSG. We discussed Semen Analysis to be scheduled at [facility] on [address]. # given. Order to be signed and faxed today. Her husband to fax me insurance card. He is only off one day a month so will schedule it then. KW

## 2012-11-17 ENCOUNTER — Other Ambulatory Visit: Payer: Self-pay | Admitting: Gynecology

## 2012-11-17 ENCOUNTER — Telehealth: Payer: Self-pay | Admitting: *Deleted

## 2012-11-17 DIAGNOSIS — N979 Female infertility, unspecified: Secondary | ICD-10-CM

## 2012-11-17 MED ORDER — DOXYCYCLINE HYCLATE 100 MG PO CAPS: 100.0000 mg | ORAL_CAPSULE | Freq: Two times a day (BID) | ORAL | Status: DC

## 2012-11-17 NOTE — Telephone Encounter (Signed)
Period started Sun 12/22 needs to schedule HSG. Apt 11/21/12 at [facility] at 2pm arrive at 130pm. Order placed. Vibramycin 100 BID x 3 days starting day before procedure. Rx sent. Pt informed. Pt will pick kit up for SA. Pt will be contacted with results. KW

## 2012-11-21 ENCOUNTER — Ambulatory Visit (HOSPITAL_COMMUNITY)
Admission: RE | Admit: 2012-11-21 | Discharge: 2012-11-21 | Disposition: A | Discharge status: Home / Self Care | Payer: 59 | Source: Ambulatory Visit | Attending: Gynecology | Admitting: Gynecology

## 2012-11-21 DIAGNOSIS — N979 Female infertility, unspecified: Secondary | ICD-10-CM | POA: Insufficient documentation

## 2012-11-21 MED ORDER — IOHEXOL 300 MG/ML  SOLN
15.0000 mL | Freq: Once | INTRAMUSCULAR | Status: AC | PRN
  Administered 2012-11-21: 15 mL

## 2013-02-14 ENCOUNTER — Ambulatory Visit (INDEPENDENT_AMBULATORY_CARE_PROVIDER_SITE_OTHER): Payer: 59 | Admitting: Gynecology

## 2013-02-14 ENCOUNTER — Encounter: Payer: Self-pay | Admitting: Gynecology

## 2013-02-14 DIAGNOSIS — Z309 Encounter for contraceptive management, unspecified: Secondary | ICD-10-CM

## 2013-02-14 DIAGNOSIS — N926 Irregular menstruation, unspecified: Secondary | ICD-10-CM

## 2013-02-14 LAB — FOLLICLE STIMULATING HORMONE: FSH: 6.6 m[IU]/mL

## 2013-02-14 LAB — TSH: TSH: 1.333 u[IU]/mL (ref 0.350–4.500)

## 2013-02-14 LAB — PROLACTIN: Prolactin: 10 ng/mL

## 2013-02-14 NOTE — Patient Instructions (Addendum)
Followup with contraceptive decision. Office will call you with lab results

## 2013-02-14 NOTE — Progress Notes (Signed)
Patient presents to discuss contraceptive options. Has a history of infertility and irregular menses where she will go up to 30-40 days without menses. No intermenstrual bleeding. Recently had HSG which was normal. Husband is post to do semen analysis but to this point has refused. She now is approaching wanting to close the door to pregnancy and use more consistent contraception. Her past history is significant for cigarette smoking although has stopped for about 7 years. Also with migraine headaches with TIA type symptoms which is treatable to vasospasm.  The patient and I extensively reviewed contraceptive options to include pill, patch, ring, Nexplanon, Depo-Provera, IUD to include Mirena Skyla and ParaGard. Options for sterilization both female/female reviewed.  I discussed her history of migraines particularly with vasospastic symptoms and the risk of stroke heart attack DVT and in particular stroke associated with this. Also her history of cigarette smoking and possible increase thrombotic risk. The recommendations would be to consider Mirena IUD for menstrual suppression and contraception. The theoretical issue of progesterone absorption effecting clotting risk discussed. Literature was given and the patient wants to read over this. She'll followup with her decision. Given her history of irregular menses I did order a FSH TSH prolactin today. We have checked these in the past for similar complaints for completeness I'm going to recheck her values now.

## 2013-02-24 ENCOUNTER — Telehealth: Payer: Self-pay | Admitting: Gynecology

## 2013-02-24 ENCOUNTER — Other Ambulatory Visit: Payer: Self-pay | Admitting: Gynecology

## 2013-02-24 DIAGNOSIS — Z30431 Encounter for routine checking of intrauterine contraceptive device: Secondary | ICD-10-CM

## 2013-02-24 MED ORDER — LEVONORGESTREL 20 MCG/24HR IU IUD: INTRAUTERINE_SYSTEM | Freq: Once | INTRAUTERINE | Status: DC

## 2013-02-24 NOTE — Telephone Encounter (Signed)
I LM for pt regarding her benefits for Mirena IUD. Her [employer] ins covers it at 100% preventative benefits.She will call to let us know if she wants to proceed and was left info we need to schedule during her next cycle/WL

## 2013-10-05 ENCOUNTER — Encounter: Payer: Self-pay | Admitting: Gynecology

## 2013-10-05 ENCOUNTER — Ambulatory Visit (INDEPENDENT_AMBULATORY_CARE_PROVIDER_SITE_OTHER): Payer: 59 | Admitting: Gynecology

## 2013-10-05 VITALS — BP 112/66 | Ht 63.0 in | Wt 126.0 lb

## 2013-10-05 DIAGNOSIS — Z01419 Encounter for gynecological examination (general) (routine) without abnormal findings: Secondary | ICD-10-CM

## 2013-10-05 LAB — CBC WITH DIFFERENTIAL/PLATELET
Basophils Absolute: 0 10*3/uL (ref 0.0–0.1)
Basophils Relative: 0 % (ref 0–1)
Eosinophils Absolute: 0.2 10*3/uL (ref 0.0–0.7)
Eosinophils Relative: 2 % (ref 0–5)
HCT: 36 % (ref 36.0–46.0)
Hemoglobin: 12 g/dL (ref 12.0–15.0)
Lymphocytes Relative: 23 % (ref 12–46)
Lymphs Abs: 1.6 10*3/uL (ref 0.7–4.0)
MCH: 27 pg (ref 26.0–34.0)
MCHC: 33.3 g/dL (ref 30.0–36.0)
MCV: 80.9 fL (ref 78.0–100.0)
Monocytes Absolute: 0.3 10*3/uL (ref 0.1–1.0)
Monocytes Relative: 5 % (ref 3–12)
Neutro Abs: 5.1 10*3/uL (ref 1.7–7.7)
Neutrophils Relative %: 70 % (ref 43–77)
Platelets: 186 10*3/uL (ref 150–400)
RBC: 4.45 MIL/uL (ref 3.87–5.11)
RDW: 13.4 % (ref 11.5–15.5)
WBC: 7.3 10*3/uL (ref 4.0–10.5)

## 2013-10-05 LAB — COMPREHENSIVE METABOLIC PANEL
ALT: 23 U/L (ref 0–35)
AST: 20 U/L (ref 0–37)
Albumin: 4.1 g/dL (ref 3.5–5.2)
Alkaline Phosphatase: 51 U/L (ref 39–117)
BUN: 13 mg/dL (ref 6–23)
CO2: 29 mEq/L (ref 19–32)
Calcium: 9.2 mg/dL (ref 8.4–10.5)
Chloride: 103 mEq/L (ref 96–112)
Creat: 0.7 mg/dL (ref 0.50–1.10)
Glucose, Bld: 76 mg/dL (ref 70–99)
Potassium: 4 mEq/L (ref 3.5–5.3)
Sodium: 138 mEq/L (ref 135–145)
Total Bilirubin: 0.5 mg/dL (ref 0.3–1.2)
Total Protein: 6.2 g/dL (ref 6.0–8.3)

## 2013-10-05 LAB — LIPID PANEL
Cholesterol: 152 mg/dL (ref 0–200)
HDL: 66 mg/dL (ref >39)
LDL Cholesterol: 72 mg/dL (ref 0–99)
Total CHOL/HDL Ratio: 2.3 Ratio
Triglycerides: 69 mg/dL (ref <150)
VLDL: 14 mg/dL (ref 0–40)

## 2013-10-05 NOTE — Patient Instructions (Signed)
Follow up in one year, sooner with any issues.

## 2013-10-05 NOTE — Progress Notes (Signed)
[name] [name] 1976/09/19 [id number]        37 y.o.  G2P1011 for annual exam.  Doing well. Several issues that are below.  Past medical history,surgical history, problem list, medications, allergies, family history and social history were all reviewed and documented in the EPIC chart.  ROS:  Performed and pertinent positives and negatives are included in the history, assessment and plan .  Exam: [name] [name] assistant Filed Vitals:   10/05/13 0914  BP: 112/66  Height: 5\' 3"  (1.6 m)  Weight: 126 lb (57.153 kg)   General appearance  Normal Skin grossly normal Head/Neck normal with no cervical or supraclavicular adenopathy thyroid normal Lungs  clear Cardiac RR, without RMG Abdominal  soft, nontender, without masses, organomegaly or hernia Breasts  examined lying and sitting without masses, retractions, discharge or axillary adenopathy. Pelvic  Ext/BUS/vagina  normal  Cervix  normal  Uterus  anteverted, normal size, shape and contour, midline and mobile nontender   Adnexa  Without masses or tenderness    Anus and perineum  normal   Rectovaginal  normal sphincter tone without palpated masses or tenderness.    Assessment/Plan:  37 y.o. G58P1011 female for annual exam.   1. Irregular menses. Patient's periods come every 4-6 weeks. Occasionally 8 weeks. No bleeding in between. Had been contemplating pregnancy and had a normal HSG and normal hormone studies to include FSH TSH and prolactin. Husband never followed up for semen analysis.  Currently using withdrawal method for contraception. We discussed options to include Clomid/Femara stimulation. Would prefer that the husband do a semen analysis first just to make sure. Patient had also contemplated Mirena IUD as she was still on the fence how aggressive they want to be and she was looking at the menstrual regulation as an issue also. Patient and I reviewed her age and the issues of biological clock. She and her husband will have a more pointed discussion  about how they want to proceed and she'll followup with me with her decision. She'll continue on a multivitamin with folic acid. 2. Pap smear 2012. No Pap smear done today. No history of significant abnormal Pap smears.plan repeat Pap smear next year at 3 year interval. 3. Screening mammographic recommendations between 35 and 40 reviewed. No strong family history. Patient prefers to wait closer to 40. SBE monthly reviewed. 4. Health maintenance. CBC comprehensive metabolic panel lipid profile urinalysis ordered. Followup 1 year, sooner if she wants to pursue Mirena IUD or further discuss fertility.  Note: This document was prepared with digital dictation and possible smart phrase technology. Any transcriptional errors that result from this process are unintentional.   [name] Lords MD, 9:54 AM 10/05/2013

## 2013-10-06 LAB — URINALYSIS W MICROSCOPIC + REFLEX CULTURE
Bacteria, UA: NONE SEEN
Bilirubin Urine: NEGATIVE
Casts: NONE SEEN
Crystals: NONE SEEN
Glucose, UA: NEGATIVE mg/dL
Hgb urine dipstick: NEGATIVE
Ketones, ur: NEGATIVE mg/dL
Leukocytes, UA: NEGATIVE
Nitrite: NEGATIVE
Protein, ur: NEGATIVE mg/dL
Specific Gravity, Urine: 1.012 (ref 1.005–1.030)
Urobilinogen, UA: 0.2 mg/dL (ref 0.0–1.0)
pH: 7 (ref 5.0–8.0)

## 2013-11-01 ENCOUNTER — Ambulatory Visit
Admission: RE | Admit: 2013-11-01 | Discharge: 2013-11-01 | Disposition: A | Discharge status: Home / Self Care | Payer: 59 | Source: Ambulatory Visit | Attending: Family Medicine | Admitting: Family Medicine

## 2013-11-01 ENCOUNTER — Other Ambulatory Visit: Payer: Self-pay | Admitting: Family Medicine

## 2013-11-01 DIAGNOSIS — M542 Cervicalgia: Secondary | ICD-10-CM

## 2013-11-06 IMAGING — CT CT HEAD W/O CM
1 series · 16 of 30 positions shown, 20 images · non-contrast
Comparison: 02/14/2012

CLINICAL DATA: Code stroke, some left weakness

CT HEAD WITHOUT CONTRAST
TECHNIQUE: Contiguous axial images were obtained from the base of
the skull through the vertex without contrast.

[Series 2: head routine 4.8 h37s · axial · 0.43mm/px · z∈[-144,+8]mm · 16 of 36 slices shown, 20 images]
[im 2/36  brain]
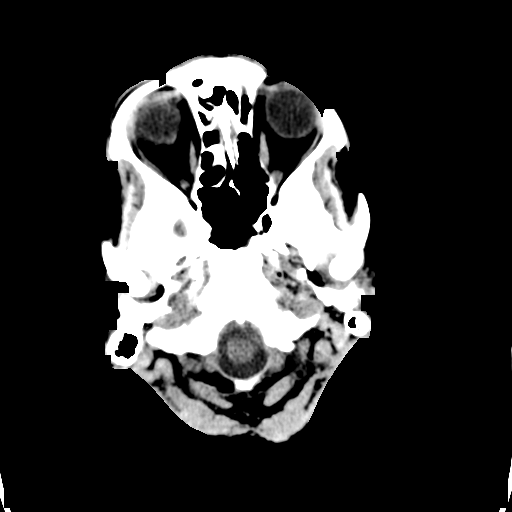
[im 2/36  bone]
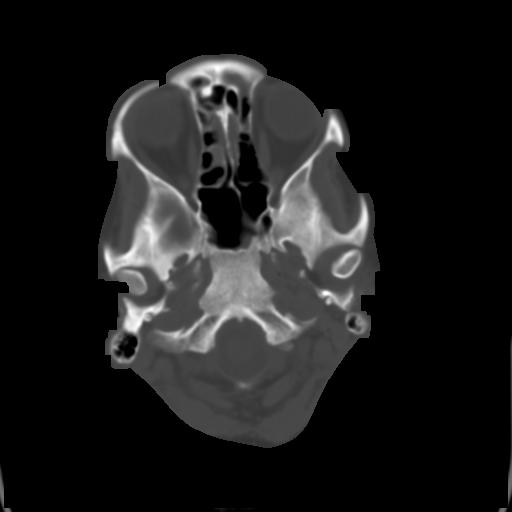
[im 4/36  brain]
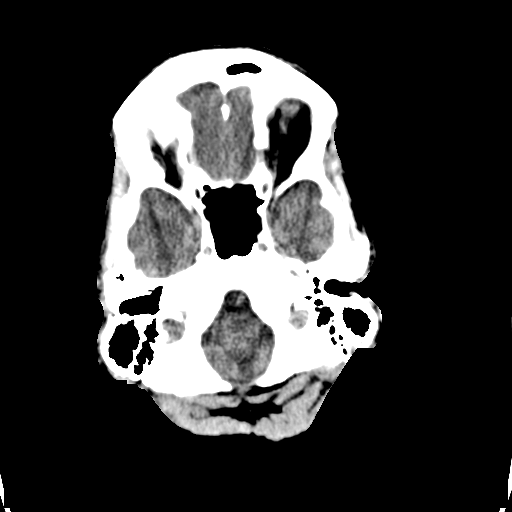
[im 7/36  brain]
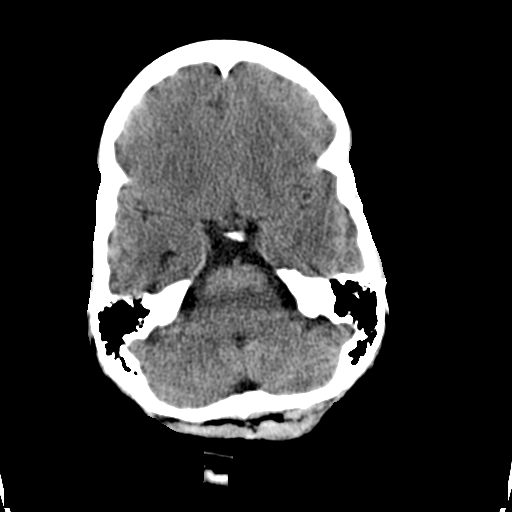
[im 9/36  brain]
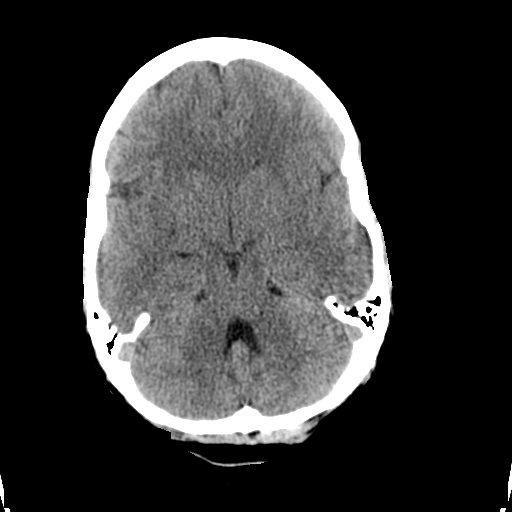
[im 10/36  brain]
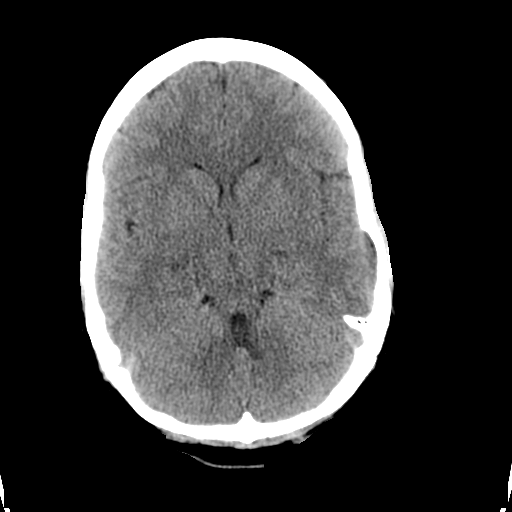
[im 10/36  bone]
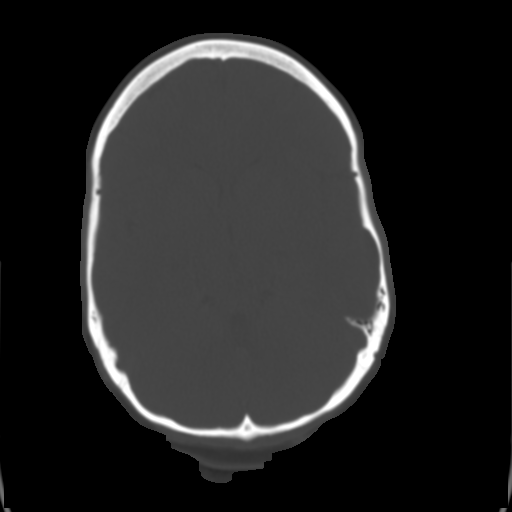
[im 13/36  brain]
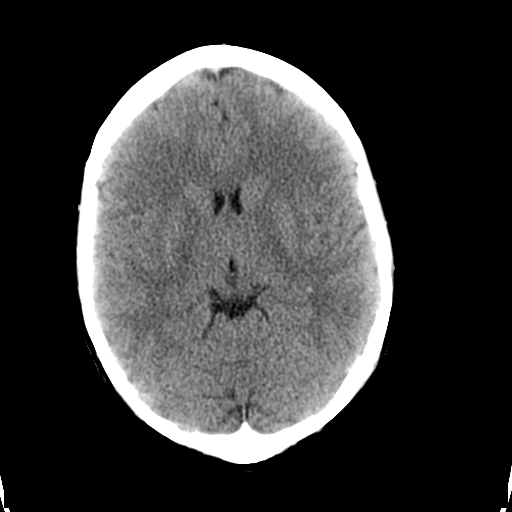
[im 15/36  brain]
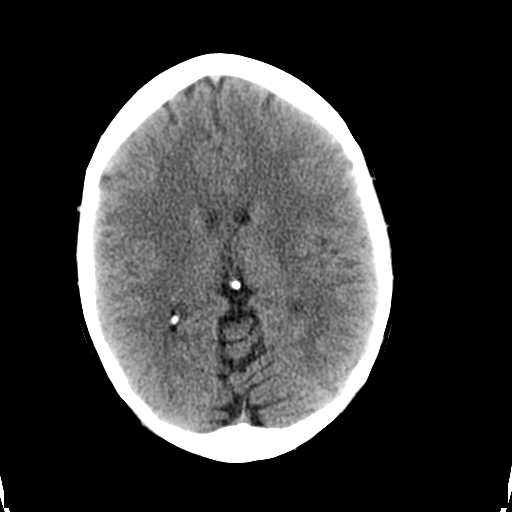
[im 17/36  brain]
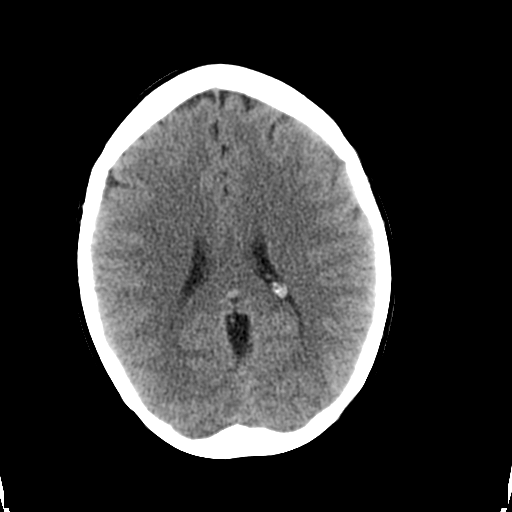
[im 19/36  brain]
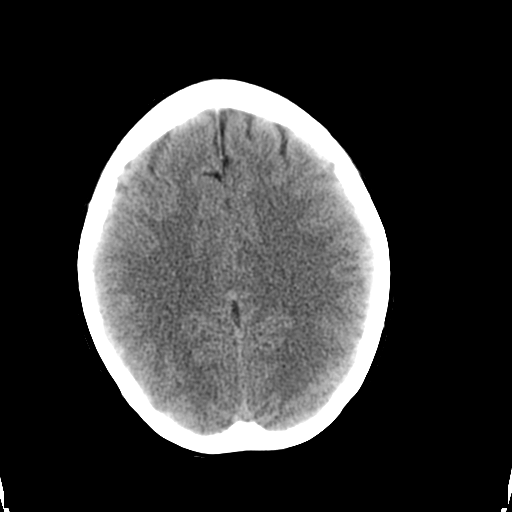
[im 19/36  bone]
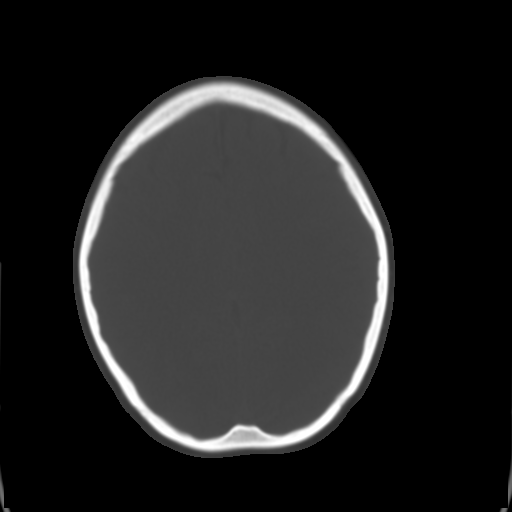
[im 21/36  brain]
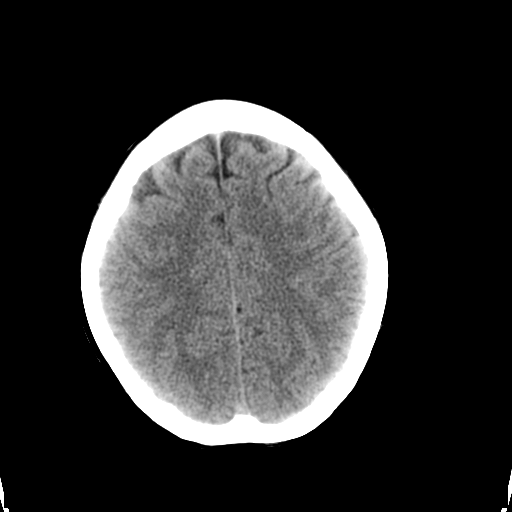
[im 23/36  brain]
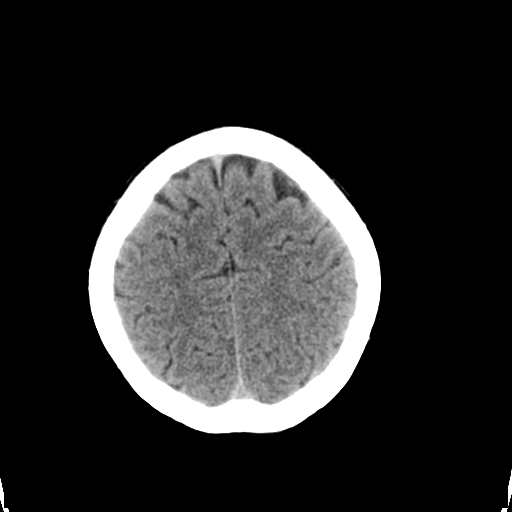
[im 26/36  brain]
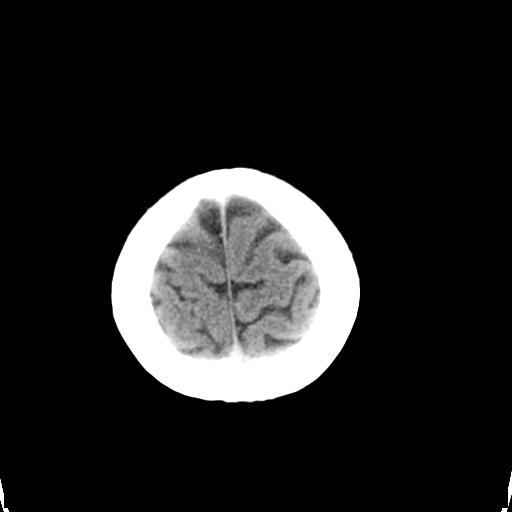
[im 27/36  brain]
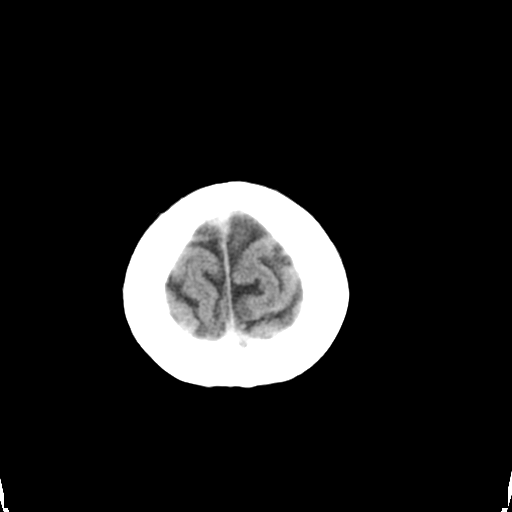
[im 27/36  bone]
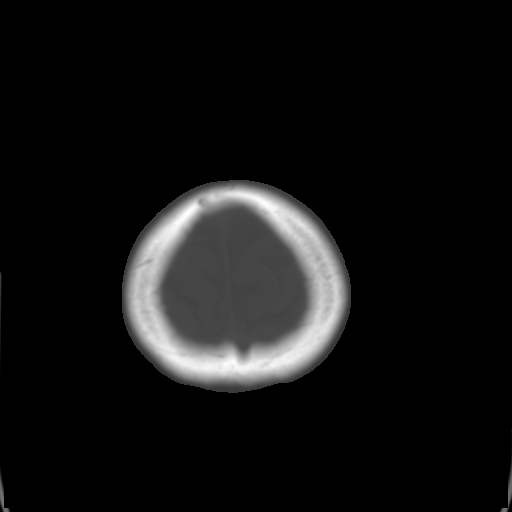
[im 29/36  brain]
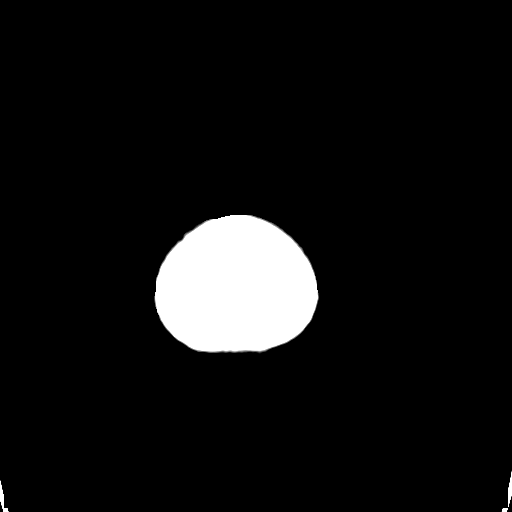
[im 32/36  brain]
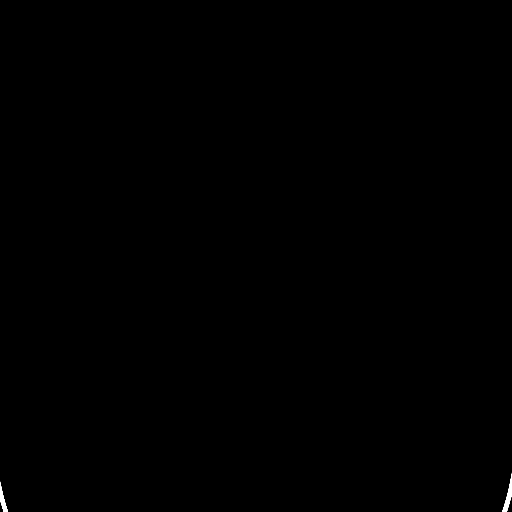
[im 34/36  brain]
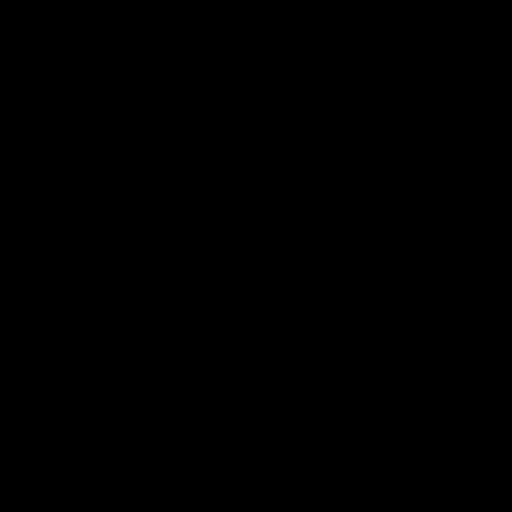

[16 of 30 positions shown; findings below may reference images not displayed]

FINDINGS: No acute intracranial hemorrhage, definite acute
infarction, mass lesion, midline shift, herniation, hydrocephalus.
Normal gray-white matter differentiation.  No extra-axial fluid.
Cisterns patent. No Cerebellar abnormality.  Mastoids clear.  Minor
right sphenoid and ethmoid mucosal thickening.
IMPRESSION: No acute intracranial finding.

Critical Value/emergent results were called by telephone at the
time of interpretation on 02/25/2012  at [DATE] p.m.  to  Dr. Lamya,
who verbally acknowledged these results.

## 2014-04-06 ENCOUNTER — Ambulatory Visit
Admission: RE | Admit: 2014-04-06 | Discharge: 2014-04-06 | Disposition: A | Discharge status: Home / Self Care | Payer: 59 | Source: Ambulatory Visit | Attending: Physician Assistant | Admitting: Physician Assistant

## 2014-04-06 ENCOUNTER — Other Ambulatory Visit: Payer: Self-pay | Admitting: Physician Assistant

## 2014-04-06 DIAGNOSIS — R059 Cough, unspecified: Secondary | ICD-10-CM

## 2014-04-06 DIAGNOSIS — R05 Cough: Secondary | ICD-10-CM

## 2014-04-23 ENCOUNTER — Telehealth: Payer: Self-pay

## 2014-04-23 ENCOUNTER — Ambulatory Visit (INDEPENDENT_AMBULATORY_CARE_PROVIDER_SITE_OTHER): Payer: 59 | Admitting: Gynecology

## 2014-04-23 ENCOUNTER — Encounter: Payer: Self-pay | Admitting: Gynecology

## 2014-04-23 DIAGNOSIS — B373 Candidiasis of vulva and vagina: Secondary | ICD-10-CM

## 2014-04-23 DIAGNOSIS — N898 Other specified noninflammatory disorders of vagina: Secondary | ICD-10-CM

## 2014-04-23 DIAGNOSIS — B3731 Acute candidiasis of vulva and vagina: Secondary | ICD-10-CM

## 2014-04-23 LAB — WET PREP FOR TRICH, YEAST, CLUE: Trich, Wet Prep: NONE SEEN

## 2014-04-23 NOTE — Patient Instructions (Addendum)
Use the Gynazole 1 sample that I gave you. Call me if the vaginal itching or discharge continues.

## 2014-04-23 NOTE — Telephone Encounter (Signed)
Patient called stating she had been on antibiotics recently for URI and now has yeast infection.  She said she took the "Azo" over the weekend and sx got better but have returned when she stopped it.  I explained to patient office policy not to treat infections over the phone but to have her come in to confirm correct diagnosis.  She is fine with that. Transferred her to the appt desk to schedule.

## 2014-04-23 NOTE — Progress Notes (Signed)
[name] [name] 05-23-1976 [id number]        38 y.o.  G2P1011 presents with several day history of vulvar itching and vaginal discharge. Had been on some antibiotics previously. No odor. No urinary symptoms such as frequency dysuria urgency.  Past medical history,surgical history, problem list, medications, allergies, family history and social history were all reviewed and documented in the EPIC chart.  Directed ROS with pertinent positives and negatives documented in the history of present illness/assessment and plan.  Exam: [name] assistant General appearance  Normal Abdomen soft without masses guarding rebound Pelvic external BUS vagina was generalized vulvitis and thick white discharge. Cervix normal. Uterus normal size mobile nontender. Adnexa without masses or tenderness.  Assessment/Plan:  38 y.o. G2P1011 history, exam and wet prep consistent with yeast vulvovaginitis.  Gynazole sample x1 provided. Followup if symptoms persist, worsen or recur.   Note: This document was prepared with digital dictation and possible smart phrase technology. Any transcriptional errors that result from this process are unintentional.   [name] MD, 3:04 PM 04/23/2014

## 2014-04-24 LAB — URINALYSIS W MICROSCOPIC + REFLEX CULTURE
Bacteria, UA: NONE SEEN
Bilirubin Urine: NEGATIVE
Casts: NONE SEEN
Crystals: NONE SEEN
Glucose, UA: NEGATIVE mg/dL
Hgb urine dipstick: NEGATIVE
Ketones, ur: NEGATIVE mg/dL
Nitrite: NEGATIVE
Protein, ur: NEGATIVE mg/dL
Specific Gravity, Urine: <1.005 — ABNORMAL LOW (ref 1.005–1.030)
Urobilinogen, UA: 0.2 mg/dL (ref 0.0–1.0)
pH: 7 (ref 5.0–8.0)

## 2014-04-26 LAB — URINE CULTURE: Colony Count: >=100000

## 2014-05-15 ENCOUNTER — Telehealth: Payer: Self-pay

## 2014-05-15 ENCOUNTER — Other Ambulatory Visit: Payer: Self-pay | Admitting: Gynecology

## 2014-05-15 MED ORDER — FLUCONAZOLE 150 MG PO TABS: ORAL_TABLET | ORAL | Status: DC

## 2014-05-15 NOTE — Telephone Encounter (Signed)
Patient was in 04/23/14 for yeast inf.  She said you gave her samples for Rx and told her if it did not clear it up to call and you would call her in a pill.  She is still having some itching.  Wants oral Rx.

## 2014-05-15 NOTE — Telephone Encounter (Signed)
Rx sent. Patient informed. 

## 2014-05-15 NOTE — Telephone Encounter (Signed)
Diflucan 150 mg 1 by mouth daily x2 days #2 pills

## 2014-09-24 ENCOUNTER — Encounter: Payer: Self-pay | Admitting: Gynecology

## 2014-10-08 ENCOUNTER — Telehealth: Payer: Self-pay | Admitting: Gynecology

## 2014-10-08 ENCOUNTER — Other Ambulatory Visit (HOSPITAL_COMMUNITY)
Admission: RE | Admit: 2014-10-08 | Discharge: 2014-10-08 | Disposition: A | Discharge status: Home / Self Care | Payer: 59 | Source: Ambulatory Visit | Attending: Gynecology | Admitting: Gynecology

## 2014-10-08 ENCOUNTER — Ambulatory Visit (INDEPENDENT_AMBULATORY_CARE_PROVIDER_SITE_OTHER): Payer: 59 | Admitting: Gynecology

## 2014-10-08 ENCOUNTER — Other Ambulatory Visit: Payer: Self-pay | Admitting: Gynecology

## 2014-10-08 ENCOUNTER — Encounter: Payer: 59 | Admitting: Gynecology

## 2014-10-08 ENCOUNTER — Encounter: Payer: Self-pay | Admitting: Gynecology

## 2014-10-08 VITALS — BP 110/60 | Ht 63.0 in | Wt 118.0 lb

## 2014-10-08 DIAGNOSIS — Z30431 Encounter for routine checking of intrauterine contraceptive device: Secondary | ICD-10-CM

## 2014-10-08 DIAGNOSIS — Z01419 Encounter for gynecological examination (general) (routine) without abnormal findings: Secondary | ICD-10-CM

## 2014-10-08 DIAGNOSIS — Z309 Encounter for contraceptive management, unspecified: Secondary | ICD-10-CM

## 2014-10-08 DIAGNOSIS — Z1151 Encounter for screening for human papillomavirus (HPV): Secondary | ICD-10-CM | POA: Insufficient documentation

## 2014-10-08 DIAGNOSIS — N926 Irregular menstruation, unspecified: Secondary | ICD-10-CM

## 2014-10-08 LAB — LIPID PANEL
Cholesterol: 152 mg/dL (ref 0–200)
HDL: 51 mg/dL (ref >39)
LDL Cholesterol: 68 mg/dL (ref 0–99)
Total CHOL/HDL Ratio: 3 Ratio
Triglycerides: 166 mg/dL — ABNORMAL HIGH (ref <150)
VLDL: 33 mg/dL (ref 0–40)

## 2014-10-08 LAB — CBC WITH DIFFERENTIAL/PLATELET
Basophils Absolute: 0 10*3/uL (ref 0.0–0.1)
Basophils Relative: 0 % (ref 0–1)
Eosinophils Absolute: 0.1 10*3/uL (ref 0.0–0.7)
Eosinophils Relative: 1 % (ref 0–5)
HCT: 38.4 % (ref 36.0–46.0)
Hemoglobin: 12.5 g/dL (ref 12.0–15.0)
Lymphocytes Relative: 26 % (ref 12–46)
Lymphs Abs: 1.4 10*3/uL (ref 0.7–4.0)
MCH: 26.5 pg (ref 26.0–34.0)
MCHC: 32.6 g/dL (ref 30.0–36.0)
MCV: 81.5 fL (ref 78.0–100.0)
MPV: 11.5 fL (ref 9.4–12.4)
Monocytes Absolute: 0.3 10*3/uL (ref 0.1–1.0)
Monocytes Relative: 5 % (ref 3–12)
Neutro Abs: 3.7 10*3/uL (ref 1.7–7.7)
Neutrophils Relative %: 68 % (ref 43–77)
Platelets: 134 10*3/uL — ABNORMAL LOW (ref 150–400)
RBC: 4.71 MIL/uL (ref 3.87–5.11)
RDW: 13.3 % (ref 11.5–15.5)
WBC: 5.5 10*3/uL (ref 4.0–10.5)

## 2014-10-08 LAB — COMPREHENSIVE METABOLIC PANEL
ALT: 17 U/L (ref 0–35)
AST: 19 U/L (ref 0–37)
Albumin: 4 g/dL (ref 3.5–5.2)
Alkaline Phosphatase: 48 U/L (ref 39–117)
BUN: 16 mg/dL (ref 6–23)
CO2: 24 mEq/L (ref 19–32)
Calcium: 9.5 mg/dL (ref 8.4–10.5)
Chloride: 107 mEq/L (ref 96–112)
Creat: 0.98 mg/dL (ref 0.50–1.10)
Glucose, Bld: 68 mg/dL — ABNORMAL LOW (ref 70–99)
Potassium: 4 mEq/L (ref 3.5–5.3)
Sodium: 141 mEq/L (ref 135–145)
Total Bilirubin: 0.3 mg/dL (ref 0.2–1.2)
Total Protein: 6.7 g/dL (ref 6.0–8.3)

## 2014-10-08 MED ORDER — LEVONORGESTREL 20 MCG/24HR IU IUD: INTRAUTERINE_SYSTEM | Freq: Once | INTRAUTERINE | Status: DC

## 2014-10-08 NOTE — Addendum Note (Signed)
Addended by: Dayna BarkerGARDNER, Hildy Nicholl K on: 10/08/2014 11:19 AM   Modules accepted: Orders, SmartSet

## 2014-10-08 NOTE — Progress Notes (Signed)
Deniece PortelaMaria Kalata 10/28/1976 [id number]        38 y.o.  G2P1011 for annual exam.  Several issues noted below.  Past medical history,surgical history, problem list, medications, allergies, family history and social history were all reviewed and documented as reviewed in the EPIC chart.  ROS:  12 system ROS performed with pertinent positives and negatives included in the history, assessment and plan.   Additional significant findings :  none   Exam: [name] Filed Vitals:   10/08/14 1013  BP: 110/60  Height: 5\' 3"  (1.6 m)  Weight: 118 lb (53.524 kg)   General appearance:  Normal affect, orientation and appearance. Skin: Grossly normal HEENT: Without gross lesions.  No cervical or supraclavicular adenopathy. Thyroid normal.  Lungs:  Clear without wheezing, rales or rhonchi Cardiac: RR, without RMG Abdominal:  Soft, nontender, without masses, guarding, rebound, organomegaly or hernia Breasts:  Examined lying and sitting without masses, retractions, discharge or axillary adenopathy. Pelvic:  Ext/BUS/vagina normal  Cervix normal  Uterus anteverted, normal size, shape and contour, midline and mobile nontender   Adnexa  Without masses or tenderness    Anus and perineum  Normal   Rectovaginal  Normal sphincter tone without palpated masses or tenderness.    Assessment/Plan:  38 y.o. 342P1011 female for annual exam with irregular menses, barrier contraception.   1. Irregular menses. Patient's menses remain irregular is a have over the years. She will vary from every 28-30 days up to every 6 weeks. Regular when they occur. Never goes more than 2 months. Had been evaluated in the past with hormonal studies all of which have been normal. Contemplating Mirena IUD now for contraception and menstrual suppression. Will follow up for placement as we discussed. 2. Contraception. Patient using rhythm/condoms. We discussed the failure risks with this. Patient is interested in pursuing Mirena IUD. I  reviewed mechanism of action, insertional procedure and risks to include perforation/migration requiring surgery to remove, infection, hormonal absorption with side effects and failure with pregnancy. She does have TIA type symptoms with migraines occasionally. The issues of hormone thrombosis risk associated with this reviewed. Primarily with estrogen but cannot guarantee with absorption of progesterone. She understands and accepts this issue. She'll make an appointment with her menses for placement. 3. Breast health. Mammographic recommendations between 35 and 40 reviewed. No history of breast cancer in the family and she prefers to wait until age 38. SBE monthly reviewed. 4. Pap smear 2012. Pap/HPV today. No history of abnormal Pap smears previously. Plan repeat Pap smear at 3-5 year interval assuming this Pap smear is normal per current screening guidelines. 5. Health maintenance. Baseline CBC comprehensive metabolic panel lipid profile urinalysis ordered. Follow up for Mirena IUD placement otherwise in one year for annual exam,     [name],Careen Mauch P MD, 10:45 AM 10/08/2014

## 2014-10-08 NOTE — Telephone Encounter (Signed)
10/08/14-Pt [facility] today [facility] & insertion for contraception at 100%, no copay. She will call first day next cycle as cycle irregular for insertion with TF/wl

## 2014-10-08 NOTE — Patient Instructions (Signed)
Follow up for IUD placement as discussed.  You may obtain a copy of any labs that were done today by logging onto MyChart as outlined in the instructions provided with your AVS (after visit summary). The office will not call with normal lab results but certainly if there are any significant abnormalities then we will contact you.   Health Maintenance, Female A healthy lifestyle and preventative care can promote health and wellness.  Maintain regular health, dental, and eye exams.  Eat a healthy diet. Foods like vegetables, fruits, whole grains, low-fat dairy products, and lean protein foods contain the nutrients you need without too many calories. Decrease your intake of foods high in solid fats, added sugars, and salt. Get information about a proper diet from your caregiver, if necessary.  Regular physical exercise is one of the most important things you can do for your health. Most adults should get at least 150 minutes of moderate-intensity exercise (any activity that increases your heart rate and causes you to sweat) each week. In addition, most adults need muscle-strengthening exercises on 2 or more days a week.   Maintain a healthy weight. The body mass index (BMI) is a screening tool to identify possible weight problems. It provides an estimate of body fat based on height and weight. Your caregiver can help determine your BMI, and can help you achieve or maintain a healthy weight. For adults 20 years and older:  A BMI below 18.5 is considered underweight.  A BMI of 18.5 to 24.9 is normal.  A BMI of 25 to 29.9 is considered overweight.  A BMI of 30 and above is considered obese.  Maintain normal blood lipids and cholesterol by exercising and minimizing your intake of saturated fat. Eat a balanced diet with plenty of fruits and vegetables. Blood tests for lipids and cholesterol should begin at age 64 and be repeated every 5 years. If your lipid or cholesterol levels are high, you are  over 50, or you are a high risk for heart disease, you may need your cholesterol levels checked more frequently.Ongoing high lipid and cholesterol levels should be treated with medicines if diet and exercise are not effective.  If you smoke, find out from your caregiver how to quit. If you do not use tobacco, do not start.  Lung cancer screening is recommended for adults aged 101 80 years who are at high risk for developing lung cancer because of a history of smoking. Yearly low-dose computed tomography (CT) is recommended for people who have at least a 30-pack-year history of smoking and are a current smoker or have quit within the past 15 years. A pack year of smoking is smoking an average of 1 pack of cigarettes a day for 1 year (for example: 1 pack a day for 30 years or 2 packs a day for 15 years). Yearly screening should continue until the smoker has stopped smoking for at least 15 years. Yearly screening should also be stopped for people who develop a health problem that would prevent them from having lung cancer treatment.  If you are pregnant, do not drink alcohol. If you are breastfeeding, be very cautious about drinking alcohol. If you are not pregnant and choose to drink alcohol, do not exceed 1 drink per day. One drink is considered to be 12 ounces (355 mL) of beer, 5 ounces (148 mL) of wine, or 1.5 ounces (44 mL) of liquor.  Avoid use of street drugs. Do not share needles with anyone. Ask for help  if you need support or instructions about stopping the use of drugs.  High blood pressure causes heart disease and increases the risk of stroke. Blood pressure should be checked at least every 1 to 2 years. Ongoing high blood pressure should be treated with medicines, if weight loss and exercise are not effective.  If you are 55 to 37 years old, ask your caregiver if you should take aspirin to prevent strokes.  Diabetes screening involves taking a blood sample to check your fasting blood sugar level.  This should be done once every 3 years, after age 45, if you are within normal weight and without risk factors for diabetes. Testing should be considered at a younger age or be carried out more frequently if you are overweight and have at least 1 risk factor for diabetes.  Breast cancer screening is essential preventative care for women. You should practice "breast self-awareness." This means understanding the normal appearance and feel of your breasts and may include breast self-examination. Any changes detected, no matter how small, should be reported to a caregiver. Women in their 20s and 30s should have a clinical breast exam (CBE) by a caregiver as part of a regular health exam every 1 to 3 years. After age 40, women should have a CBE every year. Starting at age 40, women should consider having a mammogram (breast X-ray) every year. Women who have a family history of breast cancer should talk to their caregiver about genetic screening. Women at a high risk of breast cancer should talk to their caregiver about having an MRI and a mammogram every year.  Breast cancer gene (BRCA)-related cancer risk assessment is recommended for women who have family members with BRCA-related cancers. BRCA-related cancers include breast, ovarian, tubal, and peritoneal cancers. Having family members with these cancers may be associated with an increased risk for harmful changes (mutations) in the breast cancer genes BRCA1 and BRCA2. Results of the assessment will determine the need for genetic counseling and BRCA1 and BRCA2 testing.  The Pap test is a screening test for cervical cancer. Women should have a Pap test starting at age 21. Between ages 21 and 29, Pap tests should be repeated every 2 years. Beginning at age 30, you should have a Pap test every 3 years as long as the past 3 Pap tests have been normal. If you had a hysterectomy for a problem that was not cancer or a condition that could lead to cancer, then you no  longer need Pap tests. If you are between ages 65 and 70, and you have had normal Pap tests going back 10 years, you no longer need Pap tests. If you have had past treatment for cervical cancer or a condition that could lead to cancer, you need Pap tests and screening for cancer for at least 20 years after your treatment. If Pap tests have been discontinued, risk factors (such as a new sexual partner) need to be reassessed to determine if screening should be resumed. Some women have medical problems that increase the chance of getting cervical cancer. In these cases, your caregiver may recommend more frequent screening and Pap tests.  The human papillomavirus (HPV) test is an additional test that may be used for cervical cancer screening. The HPV test looks for the virus that can cause the cell changes on the cervix. The cells collected during the Pap test can be tested for HPV. The HPV test could be used to screen women aged 30 years and older, and   should be used in women of any age who have unclear Pap test results. After the age of 7, women should have HPV testing at the same frequency as a Pap test.  Colorectal cancer can be detected and often prevented. Most routine colorectal cancer screening begins at the age of 20 and continues through age 36. However, your caregiver may recommend screening at an earlier age if you have risk factors for colon cancer. On a yearly basis, your caregiver may provide home test kits to check for hidden blood in the stool. Use of a small camera at the end of a tube, to directly examine the colon (sigmoidoscopy or colonoscopy), can detect the earliest forms of colorectal cancer. Talk to your caregiver about this at age 46, when routine screening begins. Direct examination of the colon should be repeated every 5 to 10 years through age 62, unless early forms of pre-cancerous polyps or small growths are found.  Hepatitis C blood testing is recommended for all people born  from [birth year] through 1965 and any individual with known risks for hepatitis C.  Practice safe sex. Use condoms and avoid high-risk sexual practices to reduce the spread of sexually transmitted infections (STIs). Sexually active women aged 55 and younger should be checked for Chlamydia, which is a common sexually transmitted infection. Older women with new or multiple partners should also be tested for Chlamydia. Testing for other STIs is recommended if you are sexually active and at increased risk.  Osteoporosis is a disease in which the bones lose minerals and strength with aging. This can result in serious bone fractures. The risk of osteoporosis can be identified using a bone density scan. Women ages 31 and over and women at risk for fractures or osteoporosis should discuss screening with their caregivers. Ask your caregiver whether you should be taking a calcium supplement or vitamin D to reduce the rate of osteoporosis.  Menopause can be associated with physical symptoms and risks. Hormone replacement therapy is available to decrease symptoms and risks. You should talk to your caregiver about whether hormone replacement therapy is right for you.  Use sunscreen. Apply sunscreen liberally and repeatedly throughout the day. You should seek shade when your shadow is shorter than you. Protect yourself by wearing long sleeves, pants, a wide-brimmed hat, and sunglasses year round, whenever you are outdoors.  Notify your caregiver of new moles or changes in moles, especially if there is a change in shape or color. Also notify your caregiver if a mole is larger than the size of a pencil eraser.  Stay current with your immunizations. Document Released: 05/25/2011 Document Revised: 03/06/2013 Document Reviewed: 05/25/2011 [facility] Patient Information 2014 [place].

## 2014-10-09 ENCOUNTER — Telehealth: Payer: Self-pay | Admitting: Gynecology

## 2014-10-09 DIAGNOSIS — D691 Qualitative platelet defects: Secondary | ICD-10-CM

## 2014-10-09 LAB — URINALYSIS W MICROSCOPIC + REFLEX CULTURE
Bilirubin Urine: NEGATIVE
Casts: NONE SEEN
Crystals: NONE SEEN
Glucose, UA: NEGATIVE mg/dL
Hgb urine dipstick: NEGATIVE
Ketones, ur: NEGATIVE mg/dL
Leukocytes, UA: NEGATIVE
Nitrite: NEGATIVE
Protein, ur: NEGATIVE mg/dL
Specific Gravity, Urine: <1.005 — ABNORMAL LOW (ref 1.005–1.030)
Urobilinogen, UA: 0.2 mg/dL (ref 0.0–1.0)
pH: 6 (ref 5.0–8.0)

## 2014-10-09 NOTE — Telephone Encounter (Signed)
Tell patient her platelet count is mildly low. Recommend repeating a CBC with platelet count at her convenience.

## 2014-10-10 ENCOUNTER — Other Ambulatory Visit: Payer: 59

## 2014-10-10 DIAGNOSIS — D691 Qualitative platelet defects: Secondary | ICD-10-CM

## 2014-10-10 LAB — CBC WITH DIFFERENTIAL/PLATELET
Basophils Absolute: 0 10*3/uL (ref 0.0–0.1)
Basophils Relative: 0 % (ref 0–1)
Eosinophils Absolute: 0.1 10*3/uL (ref 0.0–0.7)
Eosinophils Relative: 2 % (ref 0–5)
HCT: 39.9 % (ref 36.0–46.0)
Hemoglobin: 13 g/dL (ref 12.0–15.0)
Lymphocytes Relative: 36 % (ref 12–46)
Lymphs Abs: 2.3 10*3/uL (ref 0.7–4.0)
MCH: 26.8 pg (ref 26.0–34.0)
MCHC: 32.6 g/dL (ref 30.0–36.0)
MCV: 82.3 fL (ref 78.0–100.0)
MPV: 13.2 fL — ABNORMAL HIGH (ref 9.4–12.4)
Monocytes Absolute: 0.5 10*3/uL (ref 0.1–1.0)
Monocytes Relative: 8 % (ref 3–12)
Neutro Abs: 3.5 10*3/uL (ref 1.7–7.7)
Neutrophils Relative %: 54 % (ref 43–77)
Platelets: 166 10*3/uL (ref 150–400)
RBC: 4.85 MIL/uL (ref 3.87–5.11)
RDW: 13.3 % (ref 11.5–15.5)
WBC: 6.5 10*3/uL (ref 4.0–10.5)

## 2014-10-10 LAB — CYTOLOGY - PAP

## 2014-10-10 NOTE — Telephone Encounter (Signed)
Left message for pt to call.

## 2014-10-10 NOTE — Telephone Encounter (Signed)
Pt informed, rx sent 

## 2014-12-22 ENCOUNTER — Telehealth: Payer: Self-pay | Admitting: Neurology

## 2014-12-22 NOTE — Telephone Encounter (Signed)
I spoke with patient and then with event physician, Dr. Dara HoyerZacco, regarding patient's clearance for boxing. She stated, that she was seen by Dr. Marjory LiesPenumalli as an outpatient about a year and half ago and that he cleared her for boxing. I looked in her records and did see that she had a discharge summary on file from when she was hospitalized from 3/24 through 02/16/2012 for a suspected TIA. Stroke workup was completed and negative. She was placed on aspirin. She presented to the emergency room with similar symptoms that resolved. She was seen by neurology in the ER on 02/25/2012 and no further workup was recommended at the time. She must have seen Dr. Marjory LiesPenumalli more than 1-1/2 years ago. I was trying to lock onto her old medical record system but could not log on and was not able to give any further information but did read out the discharge summary and neurological consultation to Dr. Dara HoyerZacco, who stayed on hold. I suggested that the patient request her office note from our office during the workweek. It is possible that there is a letter or office note in the old EMR system. She can request this to be mailed to her so she does not end up in this dilemma in the future. On a regular basis he sees Dr. Lupe Carneyean Mitchell she reported.  Dr. Dara HoyerZacco did not wish to see Dr. Lupe Carneyean Mitchell as he stated he needed to speak with the neurologist. Unfortunately, I was not able to be of any further help and I apologized for that. He was very understanding.  Her current address:  [address] [place], [place] [id number]

## 2014-12-24 ENCOUNTER — Telehealth: Payer: Self-pay | Admitting: Diagnostic Neuroimaging

## 2014-12-24 NOTE — Telephone Encounter (Signed)
Pt is calling stating she needs a letter stating that she has been released from Dr. Drema PryPenumlli's care and he does not see any reason why she cannot compete in boxing matches.  Please call and advise.  She needs this completed as soon as possible.  She was last seen 01/02/13.

## 2014-12-25 ENCOUNTER — Telehealth: Payer: Self-pay | Admitting: *Deleted

## 2014-12-25 ENCOUNTER — Encounter: Payer: Self-pay | Admitting: *Deleted

## 2014-12-25 NOTE — Telephone Encounter (Signed)
Left a message asking the pt to call back to the office with a better contact number and to inform her that I had her letter to return her to boxing matches

## 2014-12-25 NOTE — Telephone Encounter (Signed)
I spoke to the pt on the phone and informed her that Dr. Marjory LiesPenumalli had stated it was ok for her to return to boxing, that we had signed a letter and that it was in the mail to her address. Pt stated a thank you for the prompt response.

## 2014-12-25 NOTE — Telephone Encounter (Signed)
Patient stated it would be better to contact her on her mobile # [phone] due [place]

## 2015-06-12 ENCOUNTER — Other Ambulatory Visit: Payer: Self-pay | Admitting: Orthopaedic Surgery

## 2015-06-12 DIAGNOSIS — M79672 Pain in left foot: Secondary | ICD-10-CM

## 2015-06-14 ENCOUNTER — Other Ambulatory Visit: Payer: Self-pay

## 2015-06-22 ENCOUNTER — Other Ambulatory Visit: Payer: Self-pay

## 2015-06-23 ENCOUNTER — Ambulatory Visit
Admission: RE | Admit: 2015-06-23 | Discharge: 2015-06-23 | Disposition: A | Discharge status: Home / Self Care | Payer: 59 | Source: Ambulatory Visit | Attending: Orthopaedic Surgery | Admitting: Orthopaedic Surgery

## 2015-06-23 DIAGNOSIS — M79672 Pain in left foot: Secondary | ICD-10-CM

## 2015-09-09 ENCOUNTER — Telehealth: Payer: Self-pay | Admitting: *Deleted

## 2015-09-09 NOTE — Telephone Encounter (Signed)
(  pt aware you are out of the office) Pt has Mirena IUD benefits checked in Nov. 2015, pt has annual scheduled on 10/10/15 with you, she would like to have IUD placed with cycle on 09/18/15. Would you prefer patient to see you first for annual this year then placement after? Or okay to proceed with IUD placed with cycle on 09/18/15? Please advise

## 2015-09-10 ENCOUNTER — Telehealth: Payer: Self-pay | Admitting: Gynecology

## 2015-09-10 NOTE — Telephone Encounter (Signed)
[name] [name] will relay to schedule.

## 2015-09-10 NOTE — Telephone Encounter (Signed)
Okay to place with her menses before annual

## 2015-09-10 NOTE — Telephone Encounter (Signed)
09/10/15-Pt was advised today that her [employer] ins will cover the MIrena and insertion for contraception at 100%. She will call next cycle to schedule/wl

## 2015-10-10 ENCOUNTER — Ambulatory Visit (INDEPENDENT_AMBULATORY_CARE_PROVIDER_SITE_OTHER): Payer: 59 | Admitting: Gynecology

## 2015-10-10 ENCOUNTER — Encounter: Payer: Self-pay | Admitting: Gynecology

## 2015-10-10 VITALS — BP 120/64 | Ht 62.0 in | Wt 123.0 lb

## 2015-10-10 DIAGNOSIS — Z01419 Encounter for gynecological examination (general) (routine) without abnormal findings: Secondary | ICD-10-CM

## 2015-10-10 DIAGNOSIS — N898 Other specified noninflammatory disorders of vagina: Secondary | ICD-10-CM

## 2015-10-10 DIAGNOSIS — Z13220 Encounter for screening for lipoid disorders: Secondary | ICD-10-CM | POA: Diagnosis not present

## 2015-10-10 LAB — CBC WITH DIFFERENTIAL/PLATELET
Basophils Absolute: 0 10*3/uL (ref 0.0–0.1)
Basophils Relative: 0 % (ref 0–1)
Eosinophils Absolute: 0.1 10*3/uL (ref 0.0–0.7)
Eosinophils Relative: 2 % (ref 0–5)
HCT: 36.8 % (ref 36.0–46.0)
Hemoglobin: 12.1 g/dL (ref 12.0–15.0)
Lymphocytes Relative: 30 % (ref 12–46)
Lymphs Abs: 1.6 10*3/uL (ref 0.7–4.0)
MCH: 27 pg (ref 26.0–34.0)
MCHC: 32.9 g/dL (ref 30.0–36.0)
MCV: 82.1 fL (ref 78.0–100.0)
MPV: 11.8 fL (ref 8.6–12.4)
Monocytes Absolute: 0.3 10*3/uL (ref 0.1–1.0)
Monocytes Relative: 6 % (ref 3–12)
Neutro Abs: 3.3 10*3/uL (ref 1.7–7.7)
Neutrophils Relative %: 62 % (ref 43–77)
Platelets: 186 10*3/uL (ref 150–400)
RBC: 4.48 MIL/uL (ref 3.87–5.11)
RDW: 12.6 % (ref 11.5–15.5)
WBC: 5.3 10*3/uL (ref 4.0–10.5)

## 2015-10-10 LAB — LIPID PANEL
Cholesterol: 166 mg/dL (ref 125–200)
HDL: 65 mg/dL (ref >=46)
LDL Cholesterol: 85 mg/dL (ref <130)
Total CHOL/HDL Ratio: 2.6 Ratio (ref <=5.0)
Triglycerides: 81 mg/dL (ref <150)
VLDL: 16 mg/dL (ref <30)

## 2015-10-10 LAB — COMPREHENSIVE METABOLIC PANEL
ALT: 14 U/L (ref 6–29)
AST: 19 U/L (ref 10–30)
Albumin: 3.7 g/dL (ref 3.6–5.1)
Alkaline Phosphatase: 41 U/L (ref 33–115)
BUN: 13 mg/dL (ref 7–25)
CO2: 27 mmol/L (ref 20–31)
Calcium: 8.9 mg/dL (ref 8.6–10.2)
Chloride: 103 mmol/L (ref 98–110)
Creat: 0.66 mg/dL (ref 0.50–1.10)
Glucose, Bld: 73 mg/dL (ref 65–99)
Potassium: 3.8 mmol/L (ref 3.5–5.3)
Sodium: 140 mmol/L (ref 135–146)
Total Bilirubin: 0.6 mg/dL (ref 0.2–1.2)
Total Protein: 6 g/dL — ABNORMAL LOW (ref 6.1–8.1)

## 2015-10-10 LAB — WET PREP FOR TRICH, YEAST, CLUE
Clue Cells Wet Prep HPF POC: NONE SEEN
Trich, Wet Prep: NONE SEEN
Yeast Wet Prep HPF POC: NONE SEEN

## 2015-10-10 NOTE — Progress Notes (Addendum)
[name] [name] 11/06/1976 [id number]        39 y.o.  G2P1011  Patient's last menstrual period was 09/21/2015. for annual exam.  Doing well.  Past medical history,surgical history, problem list, medications, allergies, family history and social history were all reviewed and documented as reviewed in the EPIC chart.  ROS:  Performed with pertinent positives and negatives included in the history, assessment and plan.   Additional significant findings :  none   Exam: [name] [name] Filed Vitals:   10/10/15 0905  BP: 120/64  Height: 5\' 2"  (1.575 m)  Weight: 123 lb (55.792 kg)   General appearance:  Normal affect, orientation and appearance. Skin: Grossly normal HEENT: Without gross lesions.  No cervical or supraclavicular adenopathy. Thyroid normal.  Lungs:  Clear without wheezing, rales or rhonchi Cardiac: RR, without RMG Abdominal:  Soft, nontender, without masses, guarding, rebound, organomegaly or hernia Breasts:  Examined lying and sitting without masses, retractions, discharge or axillary adenopathy. Pelvic:  Ext/BUS/vagina normal. Slight white discharge noted  Cervix normal  Uterus axial to anteverted, normal size, shape and contour, midline and mobile nontender   Adnexa  Without masses or tenderness    Anus and perineum  Normal   Rectovaginal  Normal sphincter tone without palpated masses or tenderness.    Assessment/Plan:  39 y.o. 592P1011 female for annual exam with mild irregular menses, rhythm/condom contraception.   1. Contraception. I again reviewed contraceptives options. With her TIA migraine variants did not feel estrogen-containing pills appropriate. I again reviewed IUD possibility. Mirena IUD with benefit of menstrual suppression discussed. There is progesterone with absorption possibilities which theoretically could affect coagulation but practically probably not. Given mild menstrual irregularity per #2 this may help with that also. Patient is seriously considering this  and will follow up if she decides to pursue this. Understands the failure risk with which she is doing now. 2. Mild irregular menses. Various to when her periods will start. No prolonged intermenstrual bleeding. We'll monitor for now. 3. Slight white discharge. Patient without symptoms such as itching irritation or odor. Wet prep is negative. Suspect physiologic and will just monitor. Discussed with patient. 4. Pap smear/HPV 2015 negative. No Pap smear done today. No history of significant abnormal Pap smears previously. 5. Mammographic recommendations between 35 and 40 again reviewed. No strong family history and she prefers to wait closer to 40. SBE monthly reviewed. 6. Health maintenance. Baseline CBC comprehensive metabolic panel lipid profile urinalysis ordered. Follow up her IUD if she chooses, otherwise in one year.   [name],[name] MD, 9:33 AM 10/10/2015

## 2015-10-10 NOTE — Patient Instructions (Signed)
Follow up for the Mirena IUD as you choose.  You may obtain a copy of any labs that were done today by logging onto MyChart as outlined in the instructions provided with your AVS (after visit summary). The office will not call with normal lab results but certainly if there are any significant abnormalities then we will contact you.   Health Maintenance Adopting a healthy lifestyle and getting preventive care can go a long way to promote health and wellness. Talk with your health care provider about what schedule of regular examinations is right for you. This is a good chance for you to check in with your provider about disease prevention and staying healthy. In between checkups, there are plenty of things you can do on your own. Experts have done a lot of research about which lifestyle changes and preventive measures are most likely to keep you healthy. Ask your health care provider for more information. WEIGHT AND DIET  Eat a healthy diet  Be sure to include plenty of vegetables, fruits, low-fat dairy products, and lean protein.  Do not eat a lot of foods high in solid fats, added sugars, or salt.  Get regular exercise. This is one of the most important things you can do for your health.  Most adults should exercise for at least 150 minutes each week. The exercise should increase your heart rate and make you sweat (moderate-intensity exercise).  Most adults should also do strengthening exercises at least twice a week. This is in addition to the moderate-intensity exercise.  Maintain a healthy weight  Body mass index (BMI) is a measurement that can be used to identify possible weight problems. It estimates body fat based on height and weight. Your health care provider can help determine your BMI and help you achieve or maintain a healthy weight.  For females 90 years of age and older:   A BMI below 18.5 is considered underweight.  A BMI of 18.5 to 24.9 is normal.  A BMI of 25 to 29.9  is considered overweight.  A BMI of 30 and above is considered obese.  Watch levels of cholesterol and blood lipids  You should start having your blood tested for lipids and cholesterol at 38 years of age, then have this test every 5 years.  You may need to have your cholesterol levels checked more often if:  Your lipid or cholesterol levels are high.  You are older than 38 years of age.  You are at high risk for heart disease.  CANCER SCREENING   Lung Cancer  Lung cancer screening is recommended for adults 36-69 years old who are at high risk for lung cancer because of a history of smoking.  A yearly low-dose CT scan of the lungs is recommended for people who:  Currently smoke.  Have quit within the past 15 years.  Have at least a 30-pack-year history of smoking. A pack year is smoking an average of one pack of cigarettes a day for 1 year.  Yearly screening should continue until it has been 15 years since you quit.  Yearly screening should stop if you develop a health problem that would prevent you from having lung cancer treatment.  Breast Cancer  Practice breast self-awareness. This means understanding how your breasts normally appear and feel.  It also means doing regular breast self-exams. Let your health care provider know about any changes, no matter how small.  If you are in your 20s or 30s, you should have a clinical  breast exam (CBE) by a health care provider every 1-3 years as part of a regular health exam.  If you are 57 or older, have a CBE every year. Also consider having a breast X-ray (mammogram) every year.  If you have a family history of breast cancer, talk to your health care provider about genetic screening.  If you are at high risk for breast cancer, talk to your health care provider about having an MRI and a mammogram every year.  Breast cancer gene (BRCA) assessment is recommended for women who have family members with BRCA-related cancers.  BRCA-related cancers include:  Breast.  Ovarian.  Tubal.  Peritoneal cancers.  Results of the assessment will determine the need for genetic counseling and BRCA1 and BRCA2 testing. Cervical Cancer Routine pelvic examinations to screen for cervical cancer are no longer recommended for nonpregnant women who are considered low risk for cancer of the pelvic organs (ovaries, uterus, and vagina) and who do not have symptoms. A pelvic examination may be necessary if you have symptoms including those associated with pelvic infections. Ask your health care provider if a screening pelvic exam is right for you.   The Pap test is the screening test for cervical cancer for women who are considered at risk.  If you had a hysterectomy for a problem that was not cancer or a condition that could lead to cancer, then you no longer need Pap tests.  If you are older than 65 years, and you have had normal Pap tests for the past 10 years, you no longer need to have Pap tests.  If you have had past treatment for cervical cancer or a condition that could lead to cancer, you need Pap tests and screening for cancer for at least 20 years after your treatment.  If you no longer get a Pap test, assess your risk factors if they change (such as having a new sexual partner). This can affect whether you should start being screened again.  Some women have medical problems that increase their chance of getting cervical cancer. If this is the case for you, your health care provider may recommend more frequent screening and Pap tests.  The human papillomavirus (HPV) test is another test that may be used for cervical cancer screening. The HPV test looks for the virus that can cause cell changes in the cervix. The cells collected during the Pap test can be tested for HPV.  The HPV test can be used to screen women 81 years of age and older. Getting tested for HPV can extend the interval between normal Pap tests from three to  five years.  An HPV test also should be used to screen women of any age who have unclear Pap test results.  After 38 years of age, women should have HPV testing as often as Pap tests.  Colorectal Cancer  This type of cancer can be detected and often prevented.  Routine colorectal cancer screening usually begins at 38 years of age and continues through 38 years of age.  Your health care provider may recommend screening at an earlier age if you have risk factors for colon cancer.  Your health care provider may also recommend using home test kits to check for hidden blood in the stool.  A small camera at the end of a tube can be used to examine your colon directly (sigmoidoscopy or colonoscopy). This is done to check for the earliest forms of colorectal cancer.  Routine screening usually begins at age  50.  Direct examination of the colon should be repeated every 5-10 years through 38 years of age. However, you may need to be screened more often if early forms of precancerous polyps or small growths are found. Skin Cancer  Check your skin from head to toe regularly.  Tell your health care provider about any new moles or changes in moles, especially if there is a change in a mole's shape or color.  Also tell your health care provider if you have a mole that is larger than the size of a pencil eraser.  Always use sunscreen. Apply sunscreen liberally and repeatedly throughout the day.  Protect yourself by wearing long sleeves, pants, a wide-brimmed hat, and sunglasses whenever you are outside. HEART DISEASE, DIABETES, AND HIGH BLOOD PRESSURE   Have your blood pressure checked at least every 1-2 years. High blood pressure causes heart disease and increases the risk of stroke.  If you are between 44 years and 30 years old, ask your health care provider if you should take aspirin to prevent strokes.  Have regular diabetes screenings. This involves taking a blood sample to check your  fasting blood sugar level.  If you are at a normal weight and have a low risk for diabetes, have this test once every three years after 38 years of age.  If you are overweight and have a high risk for diabetes, consider being tested at a younger age or more often. PREVENTING INFECTION  Hepatitis B  If you have a higher risk for hepatitis B, you should be screened for this virus. You are considered at high risk for hepatitis B if:  You were born in a country where hepatitis B is common. Ask your health care provider which countries are considered high risk.  Your parents were born in a high-risk country, and you have not been immunized against hepatitis B (hepatitis B vaccine).  You have HIV or AIDS.  You use needles to inject street drugs.  You live with someone who has hepatitis B.  You have had sex with someone who has hepatitis B.  You get hemodialysis treatment.  You take certain medicines for conditions, including cancer, organ transplantation, and autoimmune conditions. Hepatitis C  Blood testing is recommended for:  Everyone born from [birth year] through 1964.  Anyone with known risk factors for hepatitis C. Sexually transmitted infections (STIs)  You should be screened for sexually transmitted infections (STIs) including gonorrhea and chlamydia if:  You are sexually active and are younger than 38 years of age.  You are older than 38 years of age and your health care provider tells you that you are at risk for this type of infection.  Your sexual activity has changed since you were last screened and you are at an increased risk for chlamydia or gonorrhea. Ask your health care provider if you are at risk.  If you do not have HIV, but are at risk, it may be recommended that you take a prescription medicine daily to prevent HIV infection. This is called pre-exposure prophylaxis (PrEP). You are considered at risk if:  You are sexually active and do not regularly use condoms or  know the HIV status of your partner(s).  You take drugs by injection.  You are sexually active with a partner who has HIV. Talk with your health care provider about whether you are at high risk of being infected with HIV. If you choose to begin PrEP, you should first be tested for HIV. You should  then be tested every 3 months for as long as you are taking PrEP.  PREGNANCY   If you are premenopausal and you may become pregnant, ask your health care provider about preconception counseling.  If you may become pregnant, take 400 to 800 micrograms (mcg) of folic acid every day.  If you want to prevent pregnancy, talk to your health care provider about birth control (contraception). OSTEOPOROSIS AND MENOPAUSE   Osteoporosis is a disease in which the bones lose minerals and strength with aging. This can result in serious bone fractures. Your risk for osteoporosis can be identified using a bone density scan.  If you are 65 years of age or older, or if you are at risk for osteoporosis and fractures, ask your health care provider if you should be screened.  Ask your health care provider whether you should take a calcium or vitamin D supplement to lower your risk for osteoporosis.  Menopause may have certain physical symptoms and risks.  Hormone replacement therapy may reduce some of these symptoms and risks. Talk to your health care provider about whether hormone replacement therapy is right for you.  HOME CARE INSTRUCTIONS   Schedule regular health, dental, and eye exams.  Stay current with your immunizations.   Do not use any tobacco products including cigarettes, chewing tobacco, or electronic cigarettes.  If you are pregnant, do not drink alcohol.  If you are breastfeeding, limit how much and how often you drink alcohol.  Limit alcohol intake to no more than 1 drink per day for nonpregnant women. One drink equals 12 ounces of beer, 5 ounces of wine, or 1 ounces of hard liquor.  Do  not use street drugs.  Do not share needles.  Ask your health care provider for help if you need support or information about quitting drugs.  Tell your health care provider if you often feel depressed.  Tell your health care provider if you have ever been abused or do not feel safe at home. Document Released: 05/25/2011 Document Revised: 03/26/2014 Document Reviewed: 10/11/2013 [facility] Patient Information 2015 [place], [place]. This information is not intended to replace advice given to you by your health care provider. Make sure you discuss any questions you have with your health care provider.

## 2015-10-11 LAB — URINALYSIS W MICROSCOPIC + REFLEX CULTURE
Bacteria, UA: NONE SEEN [HPF]
Bilirubin Urine: NEGATIVE
Casts: NONE SEEN [LPF]
Crystals: NONE SEEN [HPF]
Glucose, UA: NEGATIVE
Hgb urine dipstick: NEGATIVE
Ketones, ur: NEGATIVE
Leukocytes, UA: NEGATIVE
Nitrite: NEGATIVE
Protein, ur: NEGATIVE
RBC / HPF: NONE SEEN RBC/HPF (ref <=2)
Specific Gravity, Urine: 1.021 (ref 1.001–1.035)
WBC, UA: NONE SEEN WBC/HPF (ref <=5)
Yeast: NONE SEEN [HPF]
pH: 5.5 (ref 5.0–8.0)

## 2016-10-12 ENCOUNTER — Encounter: Payer: 59 | Admitting: Gynecology

## 2016-10-29 ENCOUNTER — Encounter: Payer: Self-pay | Admitting: Gynecology

## 2016-10-29 ENCOUNTER — Ambulatory Visit (INDEPENDENT_AMBULATORY_CARE_PROVIDER_SITE_OTHER): Payer: 59 | Admitting: Gynecology

## 2016-10-29 VITALS — BP 116/74 | Ht 62.5 in | Wt 127.0 lb

## 2016-10-29 DIAGNOSIS — Z01419 Encounter for gynecological examination (general) (routine) without abnormal findings: Secondary | ICD-10-CM | POA: Diagnosis not present

## 2016-10-29 LAB — CBC WITH DIFFERENTIAL/PLATELET
Basophils Absolute: 0 cells/uL (ref 0–200)
Basophils Relative: 0 %
Eosinophils Absolute: 112 cells/uL (ref 15–500)
Eosinophils Relative: 2 %
HCT: 38.6 % (ref 35.0–45.0)
Hemoglobin: 12.3 g/dL (ref 11.7–15.5)
Lymphocytes Relative: 30 %
Lymphs Abs: 1680 cells/uL (ref 850–3900)
MCH: 26.6 pg — ABNORMAL LOW (ref 27.0–33.0)
MCHC: 31.9 g/dL — ABNORMAL LOW (ref 32.0–36.0)
MCV: 83.5 fL (ref 80.0–100.0)
MPV: 11.9 fL (ref 7.5–12.5)
Monocytes Absolute: 448 cells/uL (ref 200–950)
Monocytes Relative: 8 %
Neutro Abs: 3360 cells/uL (ref 1500–7800)
Neutrophils Relative %: 60 %
Platelets: 200 10*3/uL (ref 140–400)
RBC: 4.62 MIL/uL (ref 3.80–5.10)
RDW: 13.3 % (ref 11.0–15.0)
WBC: 5.6 10*3/uL (ref 3.8–10.8)

## 2016-10-29 NOTE — Progress Notes (Signed)
    [name] [name] 05/22/1976 [id number]        40 y.o.  G2P1011  for annual exam.    Past medical history,surgical history, problem list, medications, allergies, family history and social history were all reviewed and documented as reviewed in the EPIC chart.  ROS:  Performed with pertinent positives and negatives included in the history, assessment and plan.   Additional significant findings :  None   Exam: [name] [name] assistant Vitals:   10/29/16 0805  BP: 116/74  Weight: 127 lb (57.6 kg)  Height: 5' 2.5" (1.588 m)   Body mass index is 22.86 kg/m.  General appearance:  Normal affect, orientation and appearance. Skin: Grossly normal HEENT: Without gross lesions.  No cervical or supraclavicular adenopathy. Thyroid normal.  Lungs:  Clear without wheezing, rales or rhonchi Cardiac: RR, without RMG Abdominal:  Soft, nontender, without masses, guarding, rebound, organomegaly or hernia Breasts:  Examined lying and sitting without masses, retractions, discharge or axillary adenopathy. Pelvic:  Ext, BUS, Vagina normal  Cervix normal  Uterus anteverted, normal size, shape and contour, midline and mobile nontender   Adnexa without masses or tenderness    Anus and perineum normal   Rectovaginal normal sphincter tone without palpated masses or tenderness.    Assessment/Plan:  40 y.o. 422P1011 female for annual exam with regular menses, barrier contraception.   1. Contraception. I again reviewed her contraceptive options. She is still leaning towards Mirena IUD and will schedule as she chooses. Otherwise she understands and accepts of failure risks with what she is doing now. 2. Breast health. SBE monthly reviewed. We'll plan mammogram baseline next year at age 40. 3. Pap smear/HPV 09/2014 negative. No Pap smear done today. No history of significant abnormal Pap smears. Plan repeat Pap smear at 5 year interval per current screening guidelines. 4. Health maintenance. Baseline CBC and  urinalysis done. Lipid profile last year was great and not repeated. Follow up for IUD as she chooses otherwise 1 year for annual exam.   [name],Kalayah [name] P MD, 8:22 AM 10/29/2016

## 2016-10-29 NOTE — Patient Instructions (Signed)

## 2016-10-30 LAB — URINALYSIS W MICROSCOPIC + REFLEX CULTURE
Bacteria, UA: NONE SEEN [HPF]
Bilirubin Urine: NEGATIVE
Casts: NONE SEEN [LPF]
Crystals: NONE SEEN [HPF]
Glucose, UA: NEGATIVE
Hgb urine dipstick: NEGATIVE
Ketones, ur: NEGATIVE
Leukocytes, UA: NEGATIVE
Nitrite: NEGATIVE
Protein, ur: NEGATIVE
Specific Gravity, Urine: 1.023 (ref 1.001–1.035)
WBC, UA: NONE SEEN WBC/HPF (ref <=5)
Yeast: NONE SEEN [HPF]
pH: 6.5 (ref 5.0–8.0)

## 2016-10-31 LAB — URINE CULTURE: Organism ID, Bacteria: NO GROWTH

## 2016-12-21 DIAGNOSIS — J069 Acute upper respiratory infection, unspecified: Secondary | ICD-10-CM | POA: Diagnosis not present

## 2017-01-11 ENCOUNTER — Ambulatory Visit (INDEPENDENT_AMBULATORY_CARE_PROVIDER_SITE_OTHER): Payer: 59 | Admitting: Gynecology

## 2017-01-11 ENCOUNTER — Encounter: Payer: Self-pay | Admitting: Gynecology

## 2017-01-11 VITALS — BP 116/72

## 2017-01-11 DIAGNOSIS — R300 Dysuria: Secondary | ICD-10-CM | POA: Diagnosis not present

## 2017-01-11 DIAGNOSIS — B373 Candidiasis of vulva and vagina: Secondary | ICD-10-CM

## 2017-01-11 DIAGNOSIS — B3731 Acute candidiasis of vulva and vagina: Secondary | ICD-10-CM

## 2017-01-11 LAB — URINALYSIS W MICROSCOPIC + REFLEX CULTURE
Bilirubin Urine: NEGATIVE
Casts: NONE SEEN [LPF]
Crystals: NONE SEEN [HPF]
Glucose, UA: NEGATIVE
Hgb urine dipstick: NEGATIVE
Ketones, ur: NEGATIVE
Leukocytes, UA: NEGATIVE
Nitrite: NEGATIVE
Protein, ur: NEGATIVE
RBC / HPF: NONE SEEN RBC/HPF (ref <=2)
Specific Gravity, Urine: 1.02 (ref 1.001–1.035)
WBC, UA: NONE SEEN WBC/HPF (ref <=5)
pH: 5 (ref 5.0–8.0)

## 2017-01-11 LAB — WET PREP FOR TRICH, YEAST, CLUE: Trich, Wet Prep: NONE SEEN

## 2017-01-11 MED ORDER — FLUCONAZOLE 150 MG PO TABS: 150.0000 mg | ORAL_TABLET | Freq: Every day | ORAL | 0 refills | Status: DC

## 2017-01-11 NOTE — Patient Instructions (Signed)
Take the Diflucan pill daily for 3 days. Office will call you if the urine grows out any bacteria. Call if your symptoms persist or recur.

## 2017-01-11 NOTE — Progress Notes (Signed)
    [name] PortelaMaria [name] 08/15/1976 [id number]        41 y.o.  G2P1011 presents with a one-week history of vaginal irritation itching and mild dysuria. Also has a slight discharge. Has started on Azo-Standard which seems to help with the dysuria. No odor, fever or chills nausea or vomiting. Finished a course of antibiotics for a URI 2 weeks ago.  Past medical history,surgical history, problem list, medications, allergies, family history and social history were all reviewed and documented in the EPIC chart.  Directed ROS with pertinent positives and negatives documented in the history of present illness/assessment and plan.  Exam: [name] PortelaKim Gardner assistant Vitals:   01/11/17 1438  BP: 116/72   General appearance:  Normal Spine straight without CVA tenderness Abdomen soft nontender without masses guarding rebound Pelvic external BUS vagina with white discharge. Cervix normal. Uterus normal size midline mobile nontender. Adnexa without masses or tenderness.  Assessment/Plan:  41 y.o. G2P1011 with above history and exam. Wet prep is positive for yeast. Urinalysis also shows yeast. We'll treat with Diflucan 150 mg daily 3 days. Urine does show few bacteria but no significant white cells or red cells. Will culture and treat if positive. Suspect dysuria probably due to her yeast causing a urethritis. Patient will follow up if your symptoms persist, worsen or recur.    [name],Reizy Dunlow P MD, 2:51 PM 01/11/2017

## 2017-01-11 NOTE — Addendum Note (Signed)
Addended by: Dayna BarkerGARDNER, Arlisa Leclere K on: 01/11/2017 03:50 PM   Modules accepted: Orders

## 2017-01-13 LAB — URINE CULTURE: Organism ID, Bacteria: NO GROWTH

## 2017-01-30 ENCOUNTER — Encounter (HOSPITAL_COMMUNITY): Payer: Self-pay | Admitting: Emergency Medicine

## 2017-01-30 ENCOUNTER — Emergency Department (HOSPITAL_COMMUNITY): Payer: 59

## 2017-01-30 DIAGNOSIS — R0789 Other chest pain: Secondary | ICD-10-CM | POA: Insufficient documentation

## 2017-01-30 DIAGNOSIS — Z79899 Other long term (current) drug therapy: Secondary | ICD-10-CM | POA: Diagnosis not present

## 2017-01-30 DIAGNOSIS — Z87891 Personal history of nicotine dependence: Secondary | ICD-10-CM | POA: Insufficient documentation

## 2017-01-30 DIAGNOSIS — R079 Chest pain, unspecified: Secondary | ICD-10-CM | POA: Diagnosis not present

## 2017-01-30 DIAGNOSIS — J45909 Unspecified asthma, uncomplicated: Secondary | ICD-10-CM | POA: Diagnosis not present

## 2017-01-30 DIAGNOSIS — Z7982 Long term (current) use of aspirin: Secondary | ICD-10-CM | POA: Insufficient documentation

## 2017-01-30 DIAGNOSIS — Z8673 Personal history of transient ischemic attack (TIA), and cerebral infarction without residual deficits: Secondary | ICD-10-CM | POA: Insufficient documentation

## 2017-01-30 LAB — I-STAT TROPONIN, ED: Troponin i, poc: 0 ng/mL (ref 0.00–0.08)

## 2017-01-30 LAB — CBC
HCT: 37.2 % (ref 36.0–46.0)
Hemoglobin: 11.8 g/dL — ABNORMAL LOW (ref 12.0–15.0)
MCH: 26.5 pg (ref 26.0–34.0)
MCHC: 31.7 g/dL (ref 30.0–36.0)
MCV: 83.6 fL (ref 78.0–100.0)
Platelets: 171 10*3/uL (ref 150–400)
RBC: 4.45 MIL/uL (ref 3.87–5.11)
RDW: 12.5 % (ref 11.5–15.5)
WBC: 9.2 10*3/uL (ref 4.0–10.5)

## 2017-01-30 LAB — BASIC METABOLIC PANEL
Anion gap: 10 (ref 5–15)
BUN: 14 mg/dL (ref 6–20)
CO2: 27 mmol/L (ref 22–32)
Calcium: 9.2 mg/dL (ref 8.9–10.3)
Chloride: 101 mmol/L (ref 101–111)
Creatinine, Ser: 0.72 mg/dL (ref 0.44–1.00)
GFR calc Af Amer: >60 mL/min (ref >60)
GFR calc non Af Amer: >60 mL/min (ref >60)
Glucose, Bld: 87 mg/dL (ref 65–99)
Potassium: 3.4 mmol/L — ABNORMAL LOW (ref 3.5–5.1)
Sodium: 138 mmol/L (ref 135–145)

## 2017-01-30 NOTE — ED Triage Notes (Signed)
Patient with chest pain that started 2 hours ago.  She states she did have some shortness of breath earlier with the pain.  The pain started with her left arm, radiated down to her left hand.  She stated that she stood up at work and had pain in her chest that is continuing.  No nausea or vomiting.

## 2017-01-31 ENCOUNTER — Emergency Department (HOSPITAL_COMMUNITY)
Admission: EM | Admit: 2017-01-31 | Discharge: 2017-01-31 | Disposition: A | Discharge status: Home / Self Care | Payer: 59 | Attending: Emergency Medicine | Admitting: Emergency Medicine

## 2017-01-31 DIAGNOSIS — R0789 Other chest pain: Secondary | ICD-10-CM

## 2017-01-31 LAB — I-STAT TROPONIN, ED: Troponin i, poc: 0 ng/mL (ref 0.00–0.08)

## 2017-01-31 LAB — D-DIMER, QUANTITATIVE: D-Dimer, Quant: 0.34 ug/mL-FEU (ref 0.00–0.50)

## 2017-01-31 MED ORDER — NAPROXEN 500 MG PO TABS: 500.0000 mg | ORAL_TABLET | Freq: Two times a day (BID) | ORAL | 0 refills | Status: DC

## 2017-01-31 MED ORDER — KETOROLAC TROMETHAMINE 30 MG/ML IJ SOLN
30.0000 mg | Freq: Once | INTRAMUSCULAR | Status: AC
  Administered 2017-01-31: 30 mg via INTRAVENOUS
  Filled 2017-01-31: qty 1

## 2017-01-31 NOTE — Discharge Instructions (Signed)
You were seen today for chest pain. Workup is reassuring. You were screened for blood clots.  Earhart tests are also reassuring. Follow up with her primary physician and cardiology if you have recurrence of symptoms.

## 2017-01-31 NOTE — ED Provider Notes (Signed)
[facility]-EMERGENCY DEPT Provider Note   CSN: [id number] Arrival date & time: 01/30/17  2301   By signing my name below, I, [name] [name], attest that this documentation has been prepared under the direction and in the presence of Shon Baton, MD. Electronically Signed: Soijett [name], ED Scribe. 01/31/17. 1:21 AM.  History   Chief Complaint Chief Complaint  Patient presents with  . Chest Pain  . Shortness of Breath    HPI [name] [name] is a 41 y.o. female with a PMHx of TIA, asthma, who presents to the Emergency Department complaining of 2/10 CP x pressure sensation onset 2 hours ago. Pt reports associated SOB and heaviness sensation to left arm. Pt has not tried any medications for the relief of her symptoms. 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 She notes that her CP was initially to her left arm and radiated to her left hand. Pt states that she had similar symptoms 5 years ago when she had a TIA due to a blood vessel spasm in her brain. Pt denies modifying factors for her CP. Pt notes that her father had 2 MIs. Denies cough, wheezing, numbness, tingling, weakness, and any other symptoms. Denies PMHx of blood clots, ETOH use, or drug use. She states that she takes a daily 81 mg ASA.   The history is provided by the patient. No language interpreter was used.    Past Medical History:  Diagnosis Date  . Asthma   . GERD (gastroesophageal reflux disease)   . Scoliosis   . TIA (transient ischemic attack)    migraine variant, no clot but vessel spasm    Patient Active Problem List   Diagnosis Date Noted  . TIA (transient ischemic attack) 02/14/2012  . GERD (gastroesophageal reflux disease) 02/14/2012  . Asthma     Past Surgical History:  Procedure Laterality Date  . CESAREAN SECTION  1997   boy    OB History    Gravida Para Term Preterm AB Living   2 1 1   1 1    SAB TAB Ectopic Multiple Live Births     1     1       Home Medications    Prior  to Admission medications   Medication Sig Start Date End Date Taking? Authorizing Provider  ADVAIR DISKUS 250-50 MCG/DOSE AEPB Inhale 1 puff into the lungs 2 (two) times daily. 01/04/17  Yes Historical Provider, MD  aspirin 81 MG chewable tablet Chew 81 mg by mouth daily.   Yes Historical Provider, MD  levalbuterol [facility] HFA) 45 MCG/ACT inhaler Inhale 2 puffs into the lungs every 4 (four) hours as needed. For asthma.   Yes Historical Provider, MD  mometasone (NASONEX) 50 MCG/ACT nasal spray Place 2 sprays into the nose daily.    Yes Historical Provider, MD  montelukast (SINGULAIR) 10 MG tablet Take 10 mg by mouth at bedtime.   Yes Historical Provider, MD  Multiple Vitamin (MULITIVITAMIN WITH MINERALS) TABS Take 1 tablet by mouth daily.   Yes Historical Provider, MD  omeprazole (PRILOSEC) 20 MG capsule Take 20 mg by mouth daily.   Yes Historical Provider, MD  fluconazole (DIFLUCAN) 150 MG tablet Take 1 tablet (150 mg total) by mouth daily. Patient not taking: Reported on 01/31/2017 01/11/17   [name], MD  naproxen (NAPROSYN) 500 MG tablet Take 1 tablet (500 mg total) by mouth 2 (two) times daily. 01/31/17   Shon Baton, MD  Family History Family History  Problem Relation Age of Onset  . Diabetes Father   . Hypertension Father   . Hyperlipidemia Father   . Cancer Father     prostate  . Cancer Maternal Uncle     STOMACH  . Diabetes Maternal Uncle   . Cancer Maternal Grandfather     LUNG  . Cancer Paternal Grandmother     stomach  . Cancer Paternal Grandfather     lung    Social History Social History  Substance Use Topics  . Smoking status: Former Smoker    Quit date: 06/12/2011  . Smokeless tobacco: Never Used  . Alcohol use 0.0 oz/week     Comment: Rare     Allergies   Patient has no known allergies.   Review of Systems Review of Systems  Constitutional: Negative for fever.  Respiratory: Positive for shortness of breath. Negative for cough and  wheezing.   Cardiovascular: Positive for chest pain.  Gastrointestinal: Negative for abdominal pain.  Neurological: Negative for weakness and numbness.       No tingling. +Heaviness sensation to left arm.  All other systems reviewed and are negative.    Physical Exam Updated Vital Signs BP 114/73   Pulse (!) 51   Temp 98 F (36.7 C) (Oral)   Resp 17   LMP 01/17/2017 (Exact Date)   SpO2 100%   Physical Exam  Constitutional: She is oriented to person, place, and time. She appears well-developed and well-nourished. No distress.  HENT:  Head: Normocephalic and atraumatic.  Cardiovascular: Normal rate, regular rhythm and normal heart sounds.   Pulmonary/Chest: Effort normal. No respiratory distress. She has no wheezes. She exhibits tenderness.  Left-sided chest wall tenderness to palpation without crepitus  Abdominal: Soft. Bowel sounds are normal.  Neurological: She is alert and oriented to person, place, and time.  Cranial nerves II through XII intact, 5/5 strength in all 4 extremities, no dysmetria to finger-nose-finger  Skin: Skin is warm and dry.  Psychiatric: She has a normal mood and affect.  Nursing note and vitals reviewed.    ED Treatments / Results  DIAGNOSTIC STUDIES: Oxygen Saturation is 100% on RA. nl by my interpretation.    COORDINATION OF CARE: 1:19 AM Discussed treatment plan with pt at bedside which includes labs, EKG, CXR, and pt agreed to plan.   Labs (all labs ordered are listed, but only abnormal results are displayed) Labs Reviewed  BASIC METABOLIC PANEL - Abnormal; Notable for the following:       Result Value   Potassium 3.4 (*)    All other components within normal limits  CBC - Abnormal; Notable for the following:    Hemoglobin 11.8 (*)    All other components within normal limits  D-DIMER, QUANTITATIVE (NOT AT [facility])  I-STAT TROPOININ, ED  I-STAT TROPOININ, ED    EKG  EKG Interpretation  Date/Time:  Saturday January 30 2017 23:08:01  EST Ventricular Rate:  64 PR Interval:  140 QRS Duration: 92 QT Interval:  422 QTC Calculation: 435 R Axis:   70 Text Interpretation:  Normal sinus rhythm with sinus arrhythmia Confirmed by Wilkie Aye  MD, Cully Luckow ([id number]) on 01/31/2017 6:03:18 AM       Radiology Dg Chest 2 View  Result Date: 01/31/2017 CLINICAL DATA:  Chest pain and dyspnea. Left upper extremity paresthesias. EXAM: CHEST  2 VIEW COMPARISON:  04/06/2014 FINDINGS: The heart size and mediastinal contours are within normal limits. Both lungs are clear. The visualized skeletal structures  are unremarkable. IMPRESSION: No active cardiopulmonary disease. Electronically Signed   By: Ellery Plunkaniel R Mitchell M.D.   On: 01/31/2017 00:49    Procedures Procedures (including critical care time)  Medications Ordered in ED Medications  ketorolac (TORADOL) 30 MG/ML injection 30 mg (30 mg Intravenous Given 01/31/17 0128)     Initial Impression / Assessment and Plan / ED Course  I have reviewed the triage vital signs and the nursing notes.  Pertinent labs & imaging results that were available during my care of the patient were reviewed by me and considered in my medical decision making (see chart for details).    Patient presents with chest pain. Some features somewhat concerning for ACS however, the patient is relatively low risk. EKG is reassuring.  D-dimer is negative. Chest pain is reproducible. Patient was given Toradol. Repeat troponin obtained and is negative. Patient had complete resolution of symptoms after Toradol. Suspect at least some chest wall component. Will have patient follow up with her primary physician and cardiology. Naproxen as needed for pain.  Final Clinical Impressions(s) / ED Diagnoses   Final diagnoses:  Chest wall pain    New Prescriptions New Prescriptions   NAPROXEN (NAPROSYN) 500 MG TABLET    Take 1 tablet (500 mg total) by mouth 2 (two) times daily.   I personally performed the services described in this  documentation, which was scribed in my presence. The recorded information has been reviewed and is accurate.     Shon Batonourtney F Loetta Connelley, MD 01/31/17 [phone]

## 2017-06-17 DIAGNOSIS — K649 Unspecified hemorrhoids: Secondary | ICD-10-CM | POA: Diagnosis not present

## 2017-08-05 DIAGNOSIS — Z131 Encounter for screening for diabetes mellitus: Secondary | ICD-10-CM | POA: Diagnosis not present

## 2017-08-05 DIAGNOSIS — Z0000 Encounter for general adult medical examination without abnormal findings: Secondary | ICD-10-CM | POA: Diagnosis not present

## 2017-11-01 ENCOUNTER — Encounter: Payer: 59 | Admitting: Gynecology

## 2017-11-03 DIAGNOSIS — R0781 Pleurodynia: Secondary | ICD-10-CM | POA: Diagnosis not present

## 2017-11-09 ENCOUNTER — Encounter: Payer: 59 | Admitting: Gynecology

## 2017-11-25 DIAGNOSIS — L730 Acne keloid: Secondary | ICD-10-CM | POA: Diagnosis not present

## 2017-11-25 DIAGNOSIS — L700 Acne vulgaris: Secondary | ICD-10-CM | POA: Diagnosis not present

## 2017-12-10 ENCOUNTER — Ambulatory Visit (INDEPENDENT_AMBULATORY_CARE_PROVIDER_SITE_OTHER): Payer: 59 | Admitting: Gynecology

## 2017-12-10 ENCOUNTER — Encounter: Payer: Self-pay | Admitting: Gynecology

## 2017-12-10 VITALS — BP 114/66 | Ht 62.0 in | Wt 138.0 lb

## 2017-12-10 DIAGNOSIS — Z01419 Encounter for gynecological examination (general) (routine) without abnormal findings: Secondary | ICD-10-CM | POA: Diagnosis not present

## 2017-12-10 DIAGNOSIS — Z113 Encounter for screening for infections with a predominantly sexual mode of transmission: Secondary | ICD-10-CM

## 2017-12-10 NOTE — Patient Instructions (Signed)
Call to Schedule your mammogram  Facilities in [place]: 1)  [facility]. [facility], [address] Phone: [phone] 2)  Dr. Bertrand at [facility]  [address] Phone: [phone]     Mammogram A mammogram is an X-ray test to find changes in a woman's breast. You should get a mammogram if:  You are 40 years of age or older  You have risk factors.   Your doctor recommends that you have one.  BEFORE THE TEST  Do not schedule the test the week before your period, especially if your breasts are sore during this time.  On the day of your mammogram:  Wash your breasts and armpits well. After washing, do not put on any deodorant or talcum powder on until after your test.   Eat and drink as you usually do.   Take your medicines as usual.   If you are diabetic and take insulin, make sure you:   Eat before coming for your test.   Take your insulin as usual.   If you cannot keep your appointment, call before the appointment to cancel. Schedule another appointment.  TEST  You will need to undress from the waist up. You will put on a hospital gown.   Your breast will be put on the mammogram machine, and it will press firmly on your breast with a piece of plastic called a compression paddle. This will make your breast flatter so that the machine can X-ray all parts of your breast.   Both breasts will be X-rayed. Each breast will be X-rayed from above and from the side. An X-ray might need to be taken again if the picture is not good enough.   The mammogram will last about 15 to 30 minutes.  AFTER THE TEST Finding out the results of your test Ask when your test results will be ready. Make sure you get your test results.  Document Released: 02/05/2009 Document Revised: 10/29/2011 Document Reviewed: 02/05/2009 [employer] Patient Information 2012 [employer].   

## 2017-12-10 NOTE — Progress Notes (Signed)
    [name] [name] 12/25/1975 [id number]        42 y.o.  G2P1011 for annual gynecologic exam.  Requests STD screening due to infidelity.  No known exposure.  Past medical history,surgical history, problem list, medications, allergies, family history and social history were all reviewed and documented as reviewed in the EPIC chart.  ROS:  Performed with pertinent positives and negatives included in the history, assessment and plan.   Additional significant findings : None   Exam: Kennon PortelaKim Gardner assistant Vitals:   12/10/17 1502  BP: 114/66  Weight: 138 lb (62.6 kg)  Height: 5\' 2"  (1.575 m)   Body mass index is 25.24 kg/m.  General appearance:  Normal affect, orientation and appearance. Skin: Grossly normal HEENT: Without gross lesions.  No cervical or supraclavicular adenopathy. Thyroid normal.  Lungs:  Clear without wheezing, rales or rhonchi Cardiac: RR, without RMG Abdominal:  Soft, nontender, without masses, guarding, rebound, organomegaly or hernia Breasts:  Examined lying and sitting without masses, retractions, discharge or axillary adenopathy. Pelvic:  Ext, BUS, Vagina: Normal  Cervix: Normal.  GC/Chlamydia  Uterus: Anteverted, normal size, shape and contour, midline and mobile nontender   Adnexa: Without masses or tenderness    Anus and perineum: Normal   Rectovaginal: Normal sphincter tone without palpated masses or tenderness.    Assessment/Plan:  42 y.o. 442P1011 female for annual gynecologic exam with regular menses, abstinent contraception.   1. Contraception.  Has ended a relationship where partner had vasectomy.  Discussed need for contraception if she reinitiates another relationship.  Stressed the need to start contraception before hand.  We had talked about alternatives in the past and she was going to proceed with Mirena IUD.  I encouraged her to do so. 2. STD screening.  No known exposure but wants to be screened due to infidelity.  GC/Chlamydia, HIV, RPR,  hepatitis B, hepatitis C done. 3. Mammography never.  Turned 41 this year.  Recommended baseline mammogram.  Names and numbers provided.  Patient agrees to call.  Breast exam normal today. 4. Pap smear/HPV 09/2014.  No Pap smear done today.  No history of abnormal Pap smears previously.  Plan repeat Pap smear at 5-year interval per current screening guidelines. 5. Health maintenance.  CBC and CMP done along with her STD screening.  Lipid profile 2 years ago excellent not repeated this year.  Follow-up in 1 year.   Dara Lordsimothy P Aniston Christman MD, 3:32 PM 12/10/2017

## 2017-12-11 LAB — C. TRACHOMATIS/N. GONORRHOEAE RNA
C. trachomatis RNA, TMA: NOT DETECTED
N. gonorrhoeae RNA, TMA: NOT DETECTED

## 2017-12-14 LAB — COMPREHENSIVE METABOLIC PANEL
AG Ratio: 2.1 (calc) (ref 1.0–2.5)
ALT: 27 U/L (ref 6–29)
AST: 28 U/L (ref 10–30)
Albumin: 4.1 g/dL (ref 3.6–5.1)
Alkaline phosphatase (APISO): 56 U/L (ref 33–115)
BUN: 17 mg/dL (ref 7–25)
CO2: 27 mmol/L (ref 20–32)
Calcium: 9.1 mg/dL (ref 8.6–10.2)
Chloride: 102 mmol/L (ref 98–110)
Creat: 0.86 mg/dL (ref 0.50–1.10)
Globulin: 2 g/dL (calc) (ref 1.9–3.7)
Glucose, Bld: 79 mg/dL (ref 65–99)
Potassium: 3.7 mmol/L (ref 3.5–5.3)
Sodium: 139 mmol/L (ref 135–146)
Total Bilirubin: 0.4 mg/dL (ref 0.2–1.2)
Total Protein: 6.1 g/dL (ref 6.1–8.1)

## 2017-12-14 LAB — CBC WITH DIFFERENTIAL/PLATELET
Basophils Absolute: 39 cells/uL (ref 0–200)
Basophils Relative: 0.7 %
Eosinophils Absolute: 253 cells/uL (ref 15–500)
Eosinophils Relative: 4.6 %
HCT: 36.7 % (ref 35.0–45.0)
Hemoglobin: 11.9 g/dL (ref 11.7–15.5)
Lymphs Abs: 1458 cells/uL (ref 850–3900)
MCH: 26.6 pg — ABNORMAL LOW (ref 27.0–33.0)
MCHC: 32.4 g/dL (ref 32.0–36.0)
MCV: 81.9 fL (ref 80.0–100.0)
MPV: 12.5 fL (ref 7.5–12.5)
Monocytes Relative: 7.7 %
Neutro Abs: 3328 cells/uL (ref 1500–7800)
Neutrophils Relative %: 60.5 %
Platelets: 238 10*3/uL (ref 140–400)
RBC: 4.48 10*6/uL (ref 3.80–5.10)
RDW: 12.2 % (ref 11.0–15.0)
Total Lymphocyte: 26.5 %
WBC mixed population: 424 cells/uL (ref 200–950)
WBC: 5.5 10*3/uL (ref 3.8–10.8)

## 2017-12-14 LAB — HEPATITIS B SURFACE ANTIGEN: Hepatitis B Surface Ag: NONREACTIVE

## 2017-12-14 LAB — HEPATITIS C ANTIBODY
Hepatitis C Ab: NONREACTIVE
SIGNAL TO CUT-OFF: 0 (ref <1.00)

## 2017-12-14 LAB — HIV ANTIBODY (ROUTINE TESTING W REFLEX): HIV 1&2 Ab, 4th Generation: NONREACTIVE

## 2017-12-14 LAB — RPR: RPR Ser Ql: NONREACTIVE

## 2017-12-28 ENCOUNTER — Ambulatory Visit: Payer: 59 | Admitting: Gynecology

## 2017-12-28 ENCOUNTER — Encounter: Payer: Self-pay | Admitting: Gynecology

## 2017-12-28 VITALS — BP 120/74

## 2017-12-28 DIAGNOSIS — B9689 Other specified bacterial agents as the cause of diseases classified elsewhere: Secondary | ICD-10-CM

## 2017-12-28 DIAGNOSIS — N760 Acute vaginitis: Secondary | ICD-10-CM

## 2017-12-28 LAB — WET PREP FOR TRICH, YEAST, CLUE

## 2017-12-28 MED ORDER — CLINDAMYCIN PHOSPHATE 2 % VA CREA: 1.0000 | TOPICAL_CREAM | Freq: Every day | VAGINAL | 0 refills | Status: DC

## 2017-12-28 NOTE — Patient Instructions (Signed)
Use vaginal cream nightly for 7 nights 

## 2017-12-28 NOTE — Progress Notes (Signed)
    [name] [name] 07/26/1976 [id number]        42 y.o.  G2P1011 presents with 1-2-day history of vaginal irritation with itching.  No odor.  No urinary symptoms such as frequency dysuria urgency low back pain fever or chills.  Also notes on a more chronic issue vaginal dryness with intercourse.  Feels that it may be related to her anxiety medication.  Past medical history,surgical history, problem list, medications, allergies, family history and social history were all reviewed and documented in the EPIC chart.  Directed ROS with pertinent positives and negatives documented in the history of present illness/assessment and plan.  Exam: [name] [name] assistant Vitals:   12/28/17 1449  BP: 120/74   General appearance:  Normal Abdomen soft nontender without masses guarding rebound Pelvic external BUS vagina with white frothy discharge.  Cervix normal.  Uterus normal size midline mobile nontender.  Adnexa without masses or tenderness  Assessment/Plan:  42 y.o. G2P1011 with history as above.  Exam and wet prep consistent with bacterial vaginosis.  Treat with Cleocin vaginal cream nightly x7 nights.  Follow-up if symptoms persist, worsen or recur.  I reviewed her vaginal dryness with intercourse and various options for OTC lubricants reviewed both water-based and oil-based.  Patient is going to go ahead and try these.  She will follow-up if it continues to be an issue.    Dara Lordsimothy P Mina Babula MD, 3:02 PM 12/28/2017

## 2017-12-28 NOTE — Addendum Note (Signed)
Addended by: Dayna BarkerGARDNER, Geremiah Fussell K on: 12/28/2017 03:28 PM   Modules accepted: Orders

## 2018-01-19 ENCOUNTER — Other Ambulatory Visit: Payer: Self-pay | Admitting: *Deleted

## 2018-01-19 ENCOUNTER — Telehealth: Payer: Self-pay | Admitting: *Deleted

## 2018-01-19 MED ORDER — METRONIDAZOLE 500 MG PO TABS: 500.0000 mg | ORAL_TABLET | Freq: Two times a day (BID) | ORAL | 0 refills | Status: DC

## 2018-01-19 NOTE — Telephone Encounter (Signed)
Okay for Flagyl 500 mg twice daily times 7 days.  Alcohol avoidance while taking.

## 2018-01-19 NOTE — Telephone Encounter (Signed)
Patient aware of Dr. Terrilee FilesFontain's reply. Rx sent to the pharmacy.

## 2018-01-19 NOTE — Telephone Encounter (Signed)
Patient seen on 12/28/2017 for vaginal itching and discomfort. Given Cleocin cream  for treatment and completed. Now the symptoms have returned. Patient wanted to know if she could have an oral treatment?   Please advise.

## 2018-03-21 DIAGNOSIS — L700 Acne vulgaris: Secondary | ICD-10-CM | POA: Diagnosis not present

## 2018-07-26 DIAGNOSIS — L700 Acne vulgaris: Secondary | ICD-10-CM | POA: Diagnosis not present

## 2018-08-25 DIAGNOSIS — L700 Acne vulgaris: Secondary | ICD-10-CM | POA: Diagnosis not present

## 2018-08-25 DIAGNOSIS — Z79899 Other long term (current) drug therapy: Secondary | ICD-10-CM | POA: Diagnosis not present

## 2018-09-01 DIAGNOSIS — Z23 Encounter for immunization: Secondary | ICD-10-CM | POA: Diagnosis not present

## 2018-09-01 DIAGNOSIS — J309 Allergic rhinitis, unspecified: Secondary | ICD-10-CM | POA: Diagnosis not present

## 2018-09-01 DIAGNOSIS — Z0000 Encounter for general adult medical examination without abnormal findings: Secondary | ICD-10-CM | POA: Diagnosis not present

## 2018-09-01 DIAGNOSIS — J45909 Unspecified asthma, uncomplicated: Secondary | ICD-10-CM | POA: Diagnosis not present

## 2018-09-26 ENCOUNTER — Other Ambulatory Visit: Payer: Self-pay | Admitting: Physician Assistant

## 2018-09-26 DIAGNOSIS — Z1231 Encounter for screening mammogram for malignant neoplasm of breast: Secondary | ICD-10-CM

## 2018-10-12 ENCOUNTER — Encounter: Payer: Self-pay | Admitting: *Deleted

## 2018-10-14 ENCOUNTER — Encounter: Payer: Self-pay | Admitting: Diagnostic Neuroimaging

## 2018-10-14 ENCOUNTER — Ambulatory Visit: Payer: 59 | Admitting: Diagnostic Neuroimaging

## 2018-10-14 VITALS — BP 109/66 | HR 74 | Ht 62.0 in | Wt 149.0 lb

## 2018-10-14 DIAGNOSIS — R519 Headache, unspecified: Secondary | ICD-10-CM

## 2018-10-14 DIAGNOSIS — R51 Headache: Secondary | ICD-10-CM | POA: Diagnosis not present

## 2018-10-14 DIAGNOSIS — G43009 Migraine without aura, not intractable, without status migrainosus: Secondary | ICD-10-CM | POA: Insufficient documentation

## 2018-10-14 DIAGNOSIS — R200 Anesthesia of skin: Secondary | ICD-10-CM | POA: Diagnosis not present

## 2018-10-14 MED ORDER — TOPIRAMATE 50 MG PO TABS: 50.0000 mg | ORAL_TABLET | Freq: Two times a day (BID) | ORAL | 12 refills | Status: DC

## 2018-10-14 NOTE — Progress Notes (Signed)
[facility]  PATIENT: [name] [name] DOB: [birth year]  REFERRING CLINICIAN: N Redmon HISTORY FROM: patient REASON FOR VISIT: new consult   HISTORICAL  CHIEF COMPLAINT:  Chief Complaint  Patient presents with  . Headache    rm 6, New Pt, " history of migraines, currently 3-5 x weekly, gotten worse since July"    HISTORY OF PRESENT ILLNESS:   NEW HPI 10/14/18: 42 year old female here for evaluation of migraines.  Patient previously seen in 2014.  Now patient having 3-4 headaches per week since July 2019.  He describes constant left-sided frontal and parietal throbbing pain with nausea and photophobia.  Some neck pain.  She has been prescribed diclofenac and promethazine with mild relief.  No specific triggering or aggravating factors.  No vision changes, blurred vision or vision loss.  No unilateral numbness or tingling.  UPDATE 01/02/13: Doing well. No new events of numbness. Has had 2 episodes of waking up at not, not feeling right, heart racing, and diff falling asleep again. Has new job (promotion) and is surrounded by more smokers.  UPDATE 06/14/12:  She denies any further episodes of numbness. She reports having a headache last week for 2 days, she took Ibuprofen 1 tab for relief.  She does report having a history of migraines in the past but has not taken any prophylactic medications.  They generally occur during her menses.  Tolerating ASA 81mg  well without bruising.  She is now exercising 4-5 days per week.    EEG 03/28/12 normal  Prolonged cardiac monitor 5/5-5/29/13 shows sinus rhythm, occasional sinus bradycardia.  No sustained arrhythmia.  PRIOR HPI (03/15/12): 42 year old right handed female with a past medical history of controlled hyperlipidemia with diet and exercise, asthma, and GERD here for evaluation of 2 seperate episodes of left arm and left leg numbness in March 2013.  The first episode she was sitting eating at a restaraunt when she suddenly became  numbness to the left side of her face arm and leg, she became disoriented, confused and felt like she could not breathe with nausea. She was unable to begin with her husband while he was talking to her. She then went to the emergency department and within the hour her symptoms have resolved.  She reports feeling lightheaded and fatigued after the episode. Denies headache, visual disturbances or loss of bowel bladder. She was admitted to the hospital for 3 days her CTA of head and neck, MRI of the brain were normal. TTE showed 55-65% EF, no source of embolus.      After being home for several days she had another episode with left facial and arm numbness for approximately half an hour while at work, her coworker says she was pale and gray.  She reports she had difficulty breathing at that time as well. She does report having a headache to the left side of her head afterwards.  She went to the emergency department and was sent home. Reports she has been eating and drinking well sleeps approximately 6-7 hours per night but awakens every 2 hours but goes back to sleep easily. Denies any fever, illnesses, episodes of dj vu, staring spells, meningitis or encephalitis or family history of seizures. She exercises regularly at least 3 times a week but has not since her last episode due to being nervous.  She reports 2 days before her symptoms occurred she seen a chiropractor in which she had  left neck manipulation with increased muscle tension.  Denies that she  has been under increased stress "out of the ordinary".    REVIEW OF SYSTEMS: Full 14 system review of systems performed and negative with exception of: Sleepiness memory loss headache dizziness feeling hot feeling cold cough wheezing weight gain fatigue blurred vision allergies not enough sleep change in appetite.  ALLERGIES: Allergies  Allergen Reactions  . Lexapro [Escitalopram Oxalate] Other (See Comments)    Can't remember, rash?    HOME  MEDICATIONS: Outpatient Medications Prior to Visit  Medication Sig Dispense Refill  . ADVAIR DISKUS 250-50 MCG/DOSE AEPB Inhale 1 puff into the lungs 2 (two) times daily.    Marland Kitchen ampicillin (PRINCIPEN) 500 MG capsule ONCE DAILY WITH FOOD DAILY BY MOUTH 90 DAYS  3  . aspirin 81 MG chewable tablet Chew 81 mg by mouth daily.    Marland Kitchen buPROPion (WELLBUTRIN XL) 150 MG 24 hr tablet 150 mg daily.  1  . clindamycin (CLEOCIN) 2 % vaginal cream Place 1 Applicatorful vaginally at bedtime. 40 g 0  . diclofenac (VOLTAREN) 50 MG EC tablet 50 mg as needed.    . DULoxetine (CYMBALTA) 60 MG capsule Take 60 mg by mouth daily.  1  . fluconazole (DIFLUCAN) 150 MG tablet Take 1 tablet (150 mg total) by mouth daily. 3 tablet 0  . levalbuterol (XOPENEX HFA) 45 MCG/ACT inhaler Inhale 2 puffs into the lungs every 4 (four) hours as needed. For asthma.    . metroNIDAZOLE (FLAGYL) 500 MG tablet Take 1 tablet (500 mg total) by mouth 2 (two) times daily. 14 tablet 0  . mometasone (NASONEX) 50 MCG/ACT nasal spray Place 2 sprays into the nose daily.     . montelukast (SINGULAIR) 10 MG tablet Take 10 mg by mouth at bedtime.    . Multiple Vitamin (MULITIVITAMIN WITH MINERALS) TABS Take 1 tablet by mouth daily.    . naproxen (NAPROSYN) 500 MG tablet Take 1 tablet (500 mg total) by mouth 2 (two) times daily. 30 tablet 0  . omeprazole (PRILOSEC) 20 MG capsule Take 20 mg by mouth daily.    . promethazine (PHENERGAN) 25 MG tablet 1 TABLET UP TO 3 TIMES A DAY AS NEEDED FOR NAUSEA ORALLY 30 DAY(S)  1  . spironolactone (ALDACTONE) 50 MG tablet Take 50 mg by mouth daily. with food  5  . tretinoin (RETIN-A) 0.025 % cream 1 APPLICATION A PEARL-SIZED AMOUNT TO FACE IN THE EVENING ONCE A DAY EXTERNALLY 30 DAYS  5  . BUPROPION HCL ER, SR, PO Take by mouth.    . DULoxetine HCl (CYMBALTA PO) Take by mouth.     No facility-administered medications prior to visit.     PAST MEDICAL HISTORY: Past Medical History:  Diagnosis Date  . Asthma   .  GERD (gastroesophageal reflux disease)   . Migraine   . Scoliosis   . TIA (transient ischemic attack) 01/2012   migraine variant, no clot but vessel spasm    PAST SURGICAL HISTORY: Past Surgical History:  Procedure Laterality Date  . CESAREAN SECTION  1997   boy    FAMILY HISTORY: Family History  Problem Relation Age of Onset  . Diabetes Father   . Hypertension Father   . Hyperlipidemia Father   . Cancer Father        prostate  . Heart attack Father        x 2  . Cancer Maternal Uncle        STOMACH  . Diabetes Maternal Uncle   . Cancer Maternal Grandfather  LUNG  . Cancer Paternal Grandmother        stomach  . Cancer Paternal Grandfather        lung    SOCIAL HISTORY: Social History   Socioeconomic History  . Marital status: Legally Separated    Spouse name: Not on file  . Number of children: 1  . Years of education: some college  . Highest education level: Not on file  Occupational History    Comment: [employer], [employer]  Social Needs  . Financial resource strain: Not on file  . Food insecurity:    Worry: Not on file    Inability: Not on file  . Transportation needs:    Medical: Not on file    Non-medical: Not on file  Tobacco Use  . Smoking status: Former Smoker    Years: 10.00    Last attempt to quit: 12/12/2005    Years since quitting: 12.8  . Smokeless tobacco: Never Used  Substance and Sexual Activity  . Alcohol use: Yes    Alcohol/week: 0.0 standard drinks    Comment: 10/14/18 2-3 x month  . Drug use: No  . Sexual activity: Not Currently    Partners: Male    Birth control/protection: Rhythm, Condom    Comment: 1st intercourse 42 yo-More than 5 partners  Lifestyle  . Physical activity:    Days per week: Not on file    Minutes per session: Not on file  . Stress: Not on file  Relationships  . Social connections:    Talks on phone: Not on file    Gets together: Not on file    Attends religious service: Not on file    Active member  of club or organization: Not on file    Attends meetings of clubs or organizations: Not on file    Relationship status: Not on file  . Intimate partner violence:    Fear of current or ex partner: Not on file    Emotionally abused: Not on file    Physically abused: Not on file    Forced sexual activity: Not on file  Other Topics Concern  . Not on file  Social History Narrative   Lives alone   Caffeine- quit 01/2012     PHYSICAL EXAM  GENERAL EXAM/CONSTITUTIONAL: Vitals:  Vitals:   10/14/18 0835  BP: 109/66  Pulse: 74  Weight: 149 lb (67.6 kg)  Height: 5\' 2"  (1.575 m)     Body mass index is 27.25 kg/m. Wt Readings from Last 3 Encounters:  10/14/18 149 lb (67.6 kg)  12/10/17 138 lb (62.6 kg)  10/29/16 127 lb (57.6 kg)     Patient is in no distress; well developed, nourished and groomed; neck is supple  CARDIOVASCULAR:  Examination of carotid arteries is normal; no carotid bruits  Regular rate and rhythm, no murmurs  Examination of peripheral vascular system by observation and palpation is normal  EYES:  Ophthalmoscopic exam of optic discs and posterior segments is normal; no papilledema or hemorrhages  Visual Acuity Screening   Right eye Left eye Both eyes  Without correction:     With correction: 20/20 20/20   Comments: Wears contacts    MUSCULOSKELETAL:  Gait, strength, tone, movements noted in Neurologic exam below  NEUROLOGIC: MENTAL STATUS:  No flowsheet data found.  awake, alert, oriented to person, place and time  recent and remote memory intact  normal attention and concentration  language fluent, comprehension intact, naming intact  fund of knowledge appropriate  CRANIAL NERVE:   2nd - no papilledema on fundoscopic exam  2nd, 3rd, 4th, 6th - pupils equal and reactive to light, visual fields full to confrontation, extraocular muscles intact, no nystagmus  5th - facial sensation symmetric  7th - facial strength symmetric  8th -  hearing intact  9th - palate elevates symmetrically, uvula midline  11th - shoulder shrug symmetric  12th - tongue protrusion midline  MOTOR:   normal bulk and tone, full strength in the BUE, BLE  SENSORY:   normal and symmetric to light touch, temperature, vibration  COORDINATION:   finger-nose-finger, fine finger movements normal  REFLEXES:   deep tendon reflexes present and symmetric  GAIT/STATION:   narrow based gait; romberg is negative     DIAGNOSTIC DATA (LABS, IMAGING, TESTING) - I reviewed patient records, labs, notes, testing and imaging myself where available.  Lab Results  Component Value Date   WBC 5.5 12/10/2017   HGB 11.9 12/10/2017   HCT 36.7 12/10/2017   MCV 81.9 12/10/2017   PLT 238 12/10/2017      Component Value Date/Time   NA 139 12/10/2017 1534   K 3.7 12/10/2017 1534   CL 102 12/10/2017 1534   CO2 27 12/10/2017 1534   GLUCOSE 79 12/10/2017 1534   BUN 17 12/10/2017 1534   CREATININE 0.86 12/10/2017 1534   CALCIUM 9.1 12/10/2017 1534   PROT 6.1 12/10/2017 1534   ALBUMIN 3.7 10/10/2015 0943   AST 28 12/10/2017 1534   ALT 27 12/10/2017 1534   ALKPHOS 41 10/10/2015 0943   BILITOT 0.4 12/10/2017 1534   GFRNONAA >60 01/30/2017 2306   GFRAA >60 01/30/2017 2306   Lab Results  Component Value Date   CHOL 166 10/10/2015   HDL 65 10/10/2015   LDLCALC 85 10/10/2015   TRIG 81 10/10/2015   CHOLHDL 2.6 10/10/2015   Lab Results  Component Value Date   HGBA1C 5.1 02/15/2012   No results found for: VITAMINB12 Lab Results  Component Value Date   TSH 1.333 02/14/2013    02/14/12 CTA head / neck - negative  02/16/12 MRI brain - Negative noncontrast MRI appearance the brain allowing for artifact from dental braces.  02/15/12 TTE  - No cardiac source of emboli was identified.    ASSESSMENT AND PLAN  42 y.o. year old female here with migraine without aura.  Also with remote history of transient left-sided numbness, possibly TIA  versus complicated migraine.  No significant vascular risk factors found.  More likely represents complicate a migraine.   Dx:  1. Numbness   2. Nonintractable headache, unspecified chronicity pattern, unspecified headache type       PLAN:   - repeat MRI brain / MRA head (rule out mass, stroke, intracranial stenosis); if normal, then may consider triptan for migraine rescue  - start topiramate 50mg  at bedtime; after 1 week increase to twice a day; drink plenty of water (for migraine prevention); may consider CGRP antagonist in future  To prevent or relieve headaches, try the following:   Cool Compress. Lie down and place a cool compress on your head.   Avoid headache triggers. If certain foods or odors seem to have triggered your migraines in the past, avoid them. A headache diary might help you identify triggers.   Include physical activity in your daily routine.   Manage stress. Find healthy ways to cope with the stressors, such as delegating tasks on your to-do list.   Practice relaxation techniques. Try deep breathing, yoga, massage  and visualization.   Eat regularly. Eating regularly scheduled meals and maintaining a healthy diet might help prevent headaches. Also, drink plenty of fluids.   Follow a regular sleep schedule. Sleep deprivation might contribute to headaches  Consider biofeedback. With this mind-body technique, you learn to control certain bodily functions - such as muscle tension, heart rate and blood pressure - to prevent headaches or reduce headache pain.  Orders Placed This Encounter  Procedures  . MR BRAIN W WO CONTRAST  . MR MRA HEAD WO CONTRAST   Meds ordered this encounter  Medications  . topiramate (TOPAMAX) 50 MG tablet    Sig: Take 1 tablet (50 mg total) by mouth 2 (two) times daily.    Dispense:  60 tablet    Refill:  12   Return in about 6 months (around 04/14/2019).    Suanne Marker, MD 10/14/2018, 9:16 AM Certified in  Neurology, Neurophysiology and Neuroimaging  [facility] [address] [place], [place] [address] [phone]

## 2018-10-14 NOTE — Patient Instructions (Signed)
-   check MRI brain / MRA head  - start topiramate 50mg  at bedtime; after 1 week increase to twice a day; drink plenty of water (for migraine prevention)  - To prevent or relieve headaches, try the following:   Cool Compress. Lie down and place a cool compress on your head.   Avoid headache triggers. If certain foods or odors seem to have triggered your migraines in the past, avoid them. A headache diary might help you identify triggers.   Include physical activity in your daily routine.   Manage stress. Find healthy ways to cope with the stressors, such as delegating tasks on your to-do list.   Practice relaxation techniques. Try deep breathing, yoga, massage and visualization.   Eat regularly. Eating regularly scheduled meals and maintaining a healthy diet might help prevent headaches. Also, drink plenty of fluids.   Follow a regular sleep schedule. Sleep deprivation might contribute to headaches  Consider biofeedback. With this mind-body technique, you learn to control certain bodily functions - such as muscle tension, heart rate and blood pressure - to prevent headaches or reduce headache pain.

## 2018-11-07 ENCOUNTER — Ambulatory Visit
Admission: RE | Admit: 2018-11-07 | Discharge: 2018-11-07 | Disposition: A | Discharge status: Home / Self Care | Payer: 59 | Source: Ambulatory Visit | Attending: Physician Assistant | Admitting: Physician Assistant

## 2018-11-07 DIAGNOSIS — Z1231 Encounter for screening mammogram for malignant neoplasm of breast: Secondary | ICD-10-CM

## 2018-11-09 ENCOUNTER — Ambulatory Visit
Admission: RE | Admit: 2018-11-09 | Discharge: 2018-11-09 | Disposition: A | Discharge status: Home / Self Care | Payer: 59 | Source: Ambulatory Visit | Attending: Diagnostic Neuroimaging | Admitting: Diagnostic Neuroimaging

## 2018-11-09 DIAGNOSIS — R51 Headache: Secondary | ICD-10-CM

## 2018-11-09 DIAGNOSIS — R200 Anesthesia of skin: Secondary | ICD-10-CM

## 2018-11-09 DIAGNOSIS — R519 Headache, unspecified: Secondary | ICD-10-CM

## 2018-11-20 ENCOUNTER — Ambulatory Visit
Admission: RE | Admit: 2018-11-20 | Discharge: 2018-11-20 | Disposition: A | Discharge status: Home / Self Care | Payer: 59 | Source: Ambulatory Visit | Attending: Diagnostic Neuroimaging | Admitting: Diagnostic Neuroimaging

## 2018-11-20 DIAGNOSIS — R51 Headache: Secondary | ICD-10-CM | POA: Diagnosis not present

## 2018-11-20 DIAGNOSIS — R200 Anesthesia of skin: Secondary | ICD-10-CM | POA: Diagnosis not present

## 2018-11-20 MED ORDER — GADOBENATE DIMEGLUMINE 529 MG/ML IV SOLN
14.0000 mL | Freq: Once | INTRAVENOUS | Status: AC | PRN
  Administered 2018-11-20: 14 mL via INTRAVENOUS

## 2018-11-29 ENCOUNTER — Telehealth: Payer: Self-pay | Admitting: *Deleted

## 2018-11-29 NOTE — Telephone Encounter (Signed)
Spoke to pt and relayed that both her MRI and MRA brain/head normal study's.  She is taking topamax 50mg  po bid, and noted that spacy feeling gone, but continues with drowsiness, decreased appetite.  Has daily headaches, and 2-3/wk migraines.  Headaches not better, with topamax.  Having to take tylenol, diclofenac most days.  Her work is Geophysical data processor, [employer], and PT at [employer].  From ofv note triptan if mri/mra normal, she would like to try migraine injectable and other oral.  Please advise.

## 2018-11-29 NOTE — Telephone Encounter (Signed)
-----   Message from Suanne Marker, MD sent at 11/28/2018 12:22 AM EST ----- Unremarkable imaging results. Please call patient. Continue current plan. -VRP

## 2018-11-30 MED ORDER — RIZATRIPTAN BENZOATE 10 MG PO TBDP: 10.0000 mg | ORAL_TABLET | ORAL | 11 refills | Status: DC | PRN

## 2018-11-30 MED ORDER — GALCANEZUMAB-GNLM 120 MG/ML ~~LOC~~ SOAJ: 120.0000 mg | SUBCUTANEOUS | 4 refills | Status: DC

## 2018-11-30 NOTE — Telephone Encounter (Signed)
Can try emgality. Can try triptan.   Meds ordered this encounter  Medications  . rizatriptan (MAXALT-MLT) 10 MG disintegrating tablet    Sig: Take 1 tablet (10 mg total) by mouth as needed for migraine. May repeat in 2 hours if needed    Dispense:  9 tablet    Refill:  11  . Galcanezumab-gnlm (EMGALITY) 120 MG/ML SOAJ    Sig: Inject 120 mg into the skin every 30 (thirty) days.    Dispense:  3 pen    Refill:  4    Suanne Marker, MD 11/30/2018, 4:05 PM Certified in Neurology, Neurophysiology and Neuroimaging  [facility] [address], [address] [place], [place] [address] [phone]

## 2018-12-01 NOTE — Telephone Encounter (Addendum)
I called pt and relayed that per Dr. Marjory Lies 2 things she can try since having SE with topamax. One is triptan, Maxalt 10mg  disolving tablet, at onset of migraine and may take another tablet if needed, with max of 2 doses per 24hour period.  Also Emgality 120mg  Autoinjector use once every 30 days as preventative.  Since [employer] may use savings card.  Go Environmental consultant.  Will also show instructions on how to do.  She is to call back if questions concerns.  Both were sent to [facility] in [place].

## 2018-12-12 ENCOUNTER — Encounter: Payer: 59 | Admitting: Gynecology

## 2018-12-30 ENCOUNTER — Encounter: Payer: Self-pay | Admitting: Gynecology

## 2018-12-30 ENCOUNTER — Ambulatory Visit (INDEPENDENT_AMBULATORY_CARE_PROVIDER_SITE_OTHER): Payer: 59 | Admitting: Gynecology

## 2018-12-30 VITALS — BP 122/80 | Ht 62.0 in | Wt 148.0 lb

## 2018-12-30 DIAGNOSIS — Z01419 Encounter for gynecological examination (general) (routine) without abnormal findings: Secondary | ICD-10-CM | POA: Diagnosis not present

## 2018-12-30 DIAGNOSIS — Z308 Encounter for other contraceptive management: Secondary | ICD-10-CM | POA: Diagnosis not present

## 2018-12-30 DIAGNOSIS — Z13220 Encounter for screening for lipoid disorders: Secondary | ICD-10-CM | POA: Diagnosis not present

## 2018-12-30 LAB — LIPID PANEL
Cholesterol: 235 mg/dL — ABNORMAL HIGH (ref <200)
HDL: 70 mg/dL (ref >=50)
LDL Cholesterol (Calc): 123 mg/dL (calc) — ABNORMAL HIGH
Non-HDL Cholesterol (Calc): 165 mg/dL (calc) — ABNORMAL HIGH (ref <130)
Total CHOL/HDL Ratio: 3.4 (calc) (ref <5.0)
Triglycerides: 293 mg/dL — ABNORMAL HIGH (ref <150)

## 2018-12-30 LAB — CBC WITH DIFFERENTIAL/PLATELET
Absolute Monocytes: 576 cells/uL (ref 200–950)
Basophils Absolute: 60 cells/uL (ref 0–200)
Basophils Relative: 0.7 %
Eosinophils Absolute: 138 cells/uL (ref 15–500)
Eosinophils Relative: 1.6 %
HCT: 39.3 % (ref 35.0–45.0)
Hemoglobin: 13.1 g/dL (ref 11.7–15.5)
Lymphs Abs: 2571 cells/uL (ref 850–3900)
MCH: 27.8 pg (ref 27.0–33.0)
MCHC: 33.3 g/dL (ref 32.0–36.0)
MCV: 83.3 fL (ref 80.0–100.0)
MPV: 13 fL — ABNORMAL HIGH (ref 7.5–12.5)
Monocytes Relative: 6.7 %
Neutro Abs: 5255 cells/uL (ref 1500–7800)
Neutrophils Relative %: 61.1 %
Platelets: 257 10*3/uL (ref 140–400)
RBC: 4.72 10*6/uL (ref 3.80–5.10)
RDW: 12.1 % (ref 11.0–15.0)
Total Lymphocyte: 29.9 %
WBC: 8.6 10*3/uL (ref 3.8–10.8)

## 2018-12-30 LAB — COMPREHENSIVE METABOLIC PANEL
AG Ratio: 1.8 (calc) (ref 1.0–2.5)
ALT: 18 U/L (ref 6–29)
AST: 17 U/L (ref 10–30)
Albumin: 4.4 g/dL (ref 3.6–5.1)
Alkaline phosphatase (APISO): 56 U/L (ref 31–125)
BUN: 15 mg/dL (ref 7–25)
CO2: 30 mmol/L (ref 20–32)
Calcium: 9.6 mg/dL (ref 8.6–10.2)
Chloride: 100 mmol/L (ref 98–110)
Creat: 0.82 mg/dL (ref 0.50–1.10)
Globulin: 2.5 g/dL (calc) (ref 1.9–3.7)
Glucose, Bld: 78 mg/dL (ref 65–99)
Potassium: 3.8 mmol/L (ref 3.5–5.3)
Sodium: 139 mmol/L (ref 135–146)
Total Bilirubin: 0.3 mg/dL (ref 0.2–1.2)
Total Protein: 6.9 g/dL (ref 6.1–8.1)

## 2018-12-30 NOTE — Progress Notes (Signed)
    [name] [name] 1976/01/01 [id number]        43 y.o.  G2P1011 for annual gynecologic exam.  Doing well without GYN complaints  Past medical history,surgical history, problem list, medications, allergies, family history and social history were all reviewed and documented as reviewed in the EPIC chart.  ROS:  Performed with pertinent positives and negatives included in the history, assessment and plan.   Additional significant findings : None   Exam: Bari Mantis assistant Vitals:   12/30/18 1425  BP: 122/80  Weight: 148 lb (67.1 kg)  Height: 5\' 2"  (1.575 m)   Body mass index is 27.07 kg/m.  General appearance:  Normal affect, orientation and appearance. Skin: Grossly normal HEENT: Without gross lesions.  No cervical or supraclavicular adenopathy. Thyroid normal.  Lungs:  Clear without wheezing, rales or rhonchi Cardiac: RR, without RMG Abdominal:  Soft, nontender, without masses, guarding, rebound, organomegaly or hernia Breasts:  Examined lying and sitting without masses, retractions, discharge or axillary adenopathy. Pelvic:  Ext, BUS, Vagina: Normal  Cervix: Normal, Pap smear/HPV  Uterus: Anteverted, normal size, shape and contour, midline and mobile nontender   Adnexa: Without masses or tenderness    Anus and perineum: Normal   Rectovaginal: Normal sphincter tone without palpated masses or tenderness.    Assessment/Plan:  43 y.o. G17P1011 female for annual gynecologic exam.  With monthly menses, inconsistent contraception  1. Contraception.  Patient using withdrawal method.  Failure risk associated with this discussed.  Options reviewed.  I encouraged patient to consider Mirena IUD.  Patient wants to think of her options and will follow-up if she wants to pursue alternative contraception. 2. Mammography 10/2018.  Continue with annual when due.  Breast exam normal today. 3. Pap smear/HPV 2015.  Pap smear/HPV today.  No history of abnormal Pap smears previously. 4. Health  maintenance.  Baseline CBC, CMP and lipid profile ordered.  Follow-up 1 year.   [name] MD, 2:39 PM 12/30/2018

## 2018-12-30 NOTE — Progress Notes (Signed)
[id number]

## 2018-12-30 NOTE — Patient Instructions (Signed)
Follow-up if you decide to pursue Mirena IUD. Follow-up in 1 year for annual exam.

## 2019-01-02 ENCOUNTER — Encounter: Payer: Self-pay | Admitting: *Deleted

## 2019-01-02 ENCOUNTER — Telehealth: Payer: Self-pay | Admitting: Diagnostic Neuroimaging

## 2019-01-02 ENCOUNTER — Other Ambulatory Visit: Payer: Self-pay | Admitting: Gynecology

## 2019-01-02 DIAGNOSIS — E782 Mixed hyperlipidemia: Secondary | ICD-10-CM

## 2019-01-02 DIAGNOSIS — E7800 Pure hypercholesterolemia, unspecified: Secondary | ICD-10-CM

## 2019-01-02 LAB — PAP, TP IMAGING W/ HPV RNA, RFLX HPV TYPE 16,18/45: HPV DNA High Risk: NOT DETECTED

## 2019-01-02 NOTE — Telephone Encounter (Signed)
Pt is needing a letter stating to her employer that bright lights bother her and that she is being treated for headaches and that on some occasions she may need special accommodations when she is having a headache. Please advise.

## 2019-01-02 NOTE — Telephone Encounter (Signed)
Ok to write letter. -VRP 

## 2019-01-02 NOTE — Telephone Encounter (Signed)
Called patient to discuss. She stated the letter needs to state she may have to move to another area where she can turn off or dim lights. She would like to be able to work from home 1-2 days/ week when she has headache as well. She requested letter be e mailed or mailed to her. We discussed FMLA papers as well; she will wait on those but understands our $50 fee and to discuss with HR dept. if she decides to pursue FMLA.

## 2019-01-02 NOTE — Telephone Encounter (Signed)
Letter composed, sent to patient. Will my chart her to be sure she receives it.

## 2019-01-03 ENCOUNTER — Ambulatory Visit (INDEPENDENT_AMBULATORY_CARE_PROVIDER_SITE_OTHER): Payer: 59

## 2019-01-03 ENCOUNTER — Ambulatory Visit (INDEPENDENT_AMBULATORY_CARE_PROVIDER_SITE_OTHER): Payer: 59 | Admitting: Orthopaedic Surgery

## 2019-01-03 ENCOUNTER — Other Ambulatory Visit: Payer: 59

## 2019-01-03 ENCOUNTER — Encounter (INDEPENDENT_AMBULATORY_CARE_PROVIDER_SITE_OTHER): Payer: Self-pay | Admitting: Orthopaedic Surgery

## 2019-01-03 DIAGNOSIS — M542 Cervicalgia: Secondary | ICD-10-CM

## 2019-01-03 MED ORDER — METHOCARBAMOL 500 MG PO TABS: 500.0000 mg | ORAL_TABLET | Freq: Every evening | ORAL | 0 refills | Status: DC | PRN

## 2019-01-03 MED ORDER — PREDNISONE 10 MG (21) PO TBPK: ORAL_TABLET | ORAL | 0 refills | Status: DC

## 2019-01-03 NOTE — Progress Notes (Signed)
Office Visit Note   Patient: [name] [name]           Date of Birth: 12/02/1975           MRN: [id number] Visit Date: 01/03/2019              Requested by: Milus Heightedmon, Noelle, PA-C [address] [place], [place] [id number] PCP: Milus Heightedmon, Noelle, PA-C   Assessment & Plan: Visit Diagnoses:  1. Cervicalgia     Plan: Impression is neck pain.  We will start the patient on a steroid taper, muscle relaxer and send her to formal physical therapy.  Should her symptoms persist, she will follow-up with us.  Follow-Up Instructions: Return if symptoms worsen or fail to improve.   Orders:  Orders Placed This Encounter  Procedures  . XR Cervical Spine 2 or 3 views   Meds ordered this encounter  Medications  . predniSONE (STERAPRED UNI-PAK 21 TAB) 10 MG (21) TBPK tablet    Sig: Take as directed    Dispense:  21 tablet    Refill:  0  . methocarbamol (ROBAXIN) 500 MG tablet    Sig: Take 1 tablet (500 mg total) by mouth at bedtime as needed for muscle spasms.    Dispense:  90 tablet    Refill:  0      Procedures: No procedures performed   Clinical Data: No additional findings.   Subjective: Chief Complaint  Patient presents with  . Neck - Pain    HPI patient is a pleasant 43 year old female who presents our clinic today with neck and parascapular pain.  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  The only focal deficits she sustained following the CVA are occasional memory issues.The pain she has is along her neck and into the parascapular region.  She describes this as constant tension with associated migraine headaches.  She has been to multiple massage therapist and taken muscle relaxers without relief of symptoms.  She denies any numbness, tingling or burning.  No previous cervical spine ESI or MRI.  Review of Systems as detailed in HPI.  All others  reviewed and are negative.   Objective: Vital Signs: LMP 12/11/2018   Physical Exam well-developed well-nourished female no acute distress.  Alert and oriented x3.  Ortho Exam examination of her cervical spine reveals no bony tenderness.  She does have slight tenderness to the para spinal musculature.  Increased pain with cervical flexion, extension and rotation.  She also has bilateral parascapular triggering.  Specialty Comments:  No specialty comments available.  Imaging: Xr Cervical Spine 2 Or 3 Views  Result Date: 01/03/2019 Straightening of the cervical spine    PMFS History: Patient Active Problem List   Diagnosis Date Noted  . Cervicalgia 01/03/2019  . Migraine without aura and without status migrainosus, not intractable 10/14/2018  . GERD (gastroesophageal reflux disease) 02/14/2012  . Asthma    Past Medical History:  Diagnosis Date  . Asthma   . GERD (gastroesophageal reflux disease)   . Migraine   . Scoliosis   . TIA (transient ischemic attack) 01/2012   migraine variant, no clot but vessel spasm    Family History  Problem Relation Age of Onset  . Diabetes Father   . Hypertension Father   . Hyperlipidemia Father   . Cancer Father        prostate  . Heart attack  Father        x 2  . Cancer Maternal Uncle        STOMACH  . Diabetes Maternal Uncle   . Cancer Maternal Grandfather        LUNG  . Cancer Paternal Grandmother        stomach  . Cancer Paternal Grandfather        lung    Past Surgical History:  Procedure Laterality Date  . CESAREAN SECTION  1997   boy   Social History   Occupational History    Comment: [employer], [employer]  Tobacco Use  . Smoking status: Former Smoker    Years: 10.00    Last attempt to quit: 12/12/2005    Years since quitting: 13.0  . Smokeless tobacco: Never Used  Substance and Sexual Activity  . Alcohol use: Yes    Alcohol/week: 0.0 standard drinks    Comment: 10/14/18 2-3 x month  . Drug use: No  . Sexual  activity: Yes    Partners: Male    Birth control/protection: Rhythm, Condom    Comment: 1st intercourse 43 yo-More than 5 partners

## 2019-01-04 ENCOUNTER — Other Ambulatory Visit: Payer: 59

## 2019-01-04 DIAGNOSIS — E782 Mixed hyperlipidemia: Secondary | ICD-10-CM

## 2019-01-04 DIAGNOSIS — E7800 Pure hypercholesterolemia, unspecified: Secondary | ICD-10-CM | POA: Diagnosis not present

## 2019-01-04 LAB — LIPID PANEL
Cholesterol: 212 mg/dL — ABNORMAL HIGH
HDL: 70 mg/dL
LDL Cholesterol (Calc): 117 mg/dL — ABNORMAL HIGH
Non-HDL Cholesterol (Calc): 142 mg/dL — ABNORMAL HIGH
Total CHOL/HDL Ratio: 3 (calc)
Triglycerides: 135 mg/dL

## 2019-01-05 ENCOUNTER — Encounter: Payer: Self-pay | Admitting: Gynecology

## 2019-01-12 DIAGNOSIS — M545 Low back pain: Secondary | ICD-10-CM | POA: Diagnosis not present

## 2019-01-12 DIAGNOSIS — R1031 Right lower quadrant pain: Secondary | ICD-10-CM | POA: Diagnosis not present

## 2019-01-24 DIAGNOSIS — L700 Acne vulgaris: Secondary | ICD-10-CM | POA: Diagnosis not present

## 2019-02-07 DIAGNOSIS — J069 Acute upper respiratory infection, unspecified: Secondary | ICD-10-CM | POA: Diagnosis not present

## 2019-02-13 ENCOUNTER — Telehealth: Payer: Self-pay | Admitting: *Deleted

## 2019-02-13 NOTE — Telephone Encounter (Signed)
Returned patient's call, and she stated she is fine with a phone visit. Reviewed meds, allergies. She  verbalized understanding, appreciation.

## 2019-02-13 NOTE — Telephone Encounter (Signed)
LVM informing patient her FU tomorrow will be converted to a phone visit due to covid 19. Requested she call back to discuss further and review medications.

## 2019-02-13 NOTE — Telephone Encounter (Signed)
Pt returned RN's call. She will be available when Rn returns the call

## 2019-02-14 ENCOUNTER — Telehealth (INDEPENDENT_AMBULATORY_CARE_PROVIDER_SITE_OTHER): Payer: 59 | Admitting: Diagnostic Neuroimaging

## 2019-02-14 ENCOUNTER — Other Ambulatory Visit: Payer: Self-pay

## 2019-02-14 DIAGNOSIS — G43009 Migraine without aura, not intractable, without status migrainosus: Secondary | ICD-10-CM

## 2019-02-14 MED ORDER — RIZATRIPTAN BENZOATE 10 MG PO TBDP: 10.0000 mg | ORAL_TABLET | ORAL | 11 refills | Status: DC | PRN

## 2019-02-14 MED ORDER — GALCANEZUMAB-GNLM 120 MG/ML ~~LOC~~ SOAJ: 120.0000 mg | SUBCUTANEOUS | 4 refills | Status: DC

## 2019-02-14 NOTE — Progress Notes (Signed)
[facility]  PATIENT: [name] [name] DOB: [birth year]  REFERRING CLINICIAN: N Redmon HISTORY FROM: patient REASON FOR VISIT: follow up    HISTORICAL  CHIEF COMPLAINT:  No chief complaint on file.   HISTORY OF PRESENT ILLNESS:   UPDATE (02/14/19, VRP): Since last visit, doing well. Tried TPX, but stopped due to side effects. Now on emgality and doing well. Avg 2 HA per week. Rizatriptan helps.   NEW HPI 10/14/18: 43 year old female here for evaluation of migraines.  Patient previously seen in 2014.  Now patient having 3-4 headaches per week since July 2019.  He describes constant left-sided frontal and parietal throbbing pain with nausea and photophobia.  Some neck pain.  She has been prescribed diclofenac and promethazine with mild relief.  No specific triggering or aggravating factors.  No vision changes, blurred vision or vision loss.  No unilateral numbness or tingling.  UPDATE 01/02/13: Doing well. No new events of numbness. Has had 2 episodes of waking up at not, not feeling right, heart racing, and diff falling asleep again. Has new job (promotion) and is surrounded by more smokers.  UPDATE 06/14/12:  She denies any further episodes of numbness. She reports having a headache last week for 2 days, she took Ibuprofen 1 tab for relief.  She does report having a history of migraines in the past but has not taken any prophylactic medications.  They generally occur during her menses.  Tolerating ASA  well without bruising.  She is now exercising 4-5 days per week.    EEG 03/28/12 normal  Prolonged cardiac monitor 5/5-5/29/13 shows sinus rhythm, occasional sinus bradycardia.  No sustained arrhythmia.  PRIOR HPI (03/15/12): 43 year old right handed female with a past medical history of controlled hyperlipidemia with diet and exercise, asthma, and GERD here for evaluation of 2 seperate episodes of left arm and left leg numbness in March 2013.  The first episode she was  sitting eating at a restaraunt when she suddenly became numbness to the left side of her face arm and leg, she became disoriented, confused and felt like she could not breathe with nausea. She was unable to begin with her husband while he was talking to her. She then went to the emergency department and within the hour her symptoms have resolved.  She reports feeling lightheaded and fatigued after the episode. Denies headache, visual disturbances or loss of bowel bladder. She was admitted to the hospital for 3 days her CTA of head and neck, MRI of the brain were normal. TTE showed 55-65% EF, no source of embolus.      After being home for several days she had another episode with left facial and arm numbness for approximately half an hour while at work, her coworker says she was pale and gray.  She reports she had difficulty breathing at that time as well. She does report having a headache to the left side of her head afterwards.  She went to the emergency department and was sent home. Reports she has been eating and drinking well sleeps approximately 6-7 hours per night but awakens every 2 hours but goes back to sleep easily. Denies any fever, illnesses, episodes of dj vu, staring spells, meningitis or encephalitis or family history of seizures. She exercises regularly at least 3 times a week but has not since her last episode due to being nervous.  She reports 2 days before her symptoms occurred she seen a chiropractor in which she had  left neck manipulation  with increased muscle tension.  Denies that she has been under increased stress "out of the ordinary".    REVIEW OF SYSTEMS: Full 14 system review of systems performed and negative with exception of: as per HPI.  ALLERGIES: Allergies  Allergen Reactions  . Lexapro [Escitalopram Oxalate] Other (See Comments)    Can't remember, rash?  Marland Kitchen Topamax [Topiramate]     Continued weight loss, drowsiness    HOME MEDICATIONS: Outpatient Medications  Prior to Visit  Medication Sig Dispense Refill  . rizatriptan (MAXALT-MLT) 10 MG disintegrating tablet Take 1 tablet (10 mg total) by mouth as needed for migraine. May repeat in 2 hours if needed 9 tablet 11  . ADVAIR DISKUS 250-50 MCG/DOSE AEPB Inhale 1 puff into the lungs 2 (two) times daily.    Marland Kitchen ampicillin (PRINCIPEN) 500 MG capsule ONCE DAILY WITH FOOD DAILY BY MOUTH 90 DAYS  3  . aspirin 81 MG chewable tablet Chew 81 mg by mouth daily.    Marland Kitchen buPROPion (WELLBUTRIN XL) 150 MG 24 hr tablet 150 mg daily.  1  . Dapsone 7.5 % GEL APPLY DAILY TOPICALLY 30 DAYS    . DULoxetine (CYMBALTA) 60 MG capsule Take 60 mg by mouth daily.  1  . Galcanezumab-gnlm (EMGALITY) 120 MG/ML SOAJ Inject 120 mg into the skin every 30 (thirty) days. 3 pen 4  . levalbuterol (XOPENEX HFA) 45 MCG/ACT inhaler Inhale 2 puffs into the lungs every 4 (four) hours as needed. For asthma.    . methocarbamol (ROBAXIN) 500 MG tablet Take 1 tablet (500 mg total) by mouth at bedtime as needed for muscle spasms. 90 tablet 0  . mometasone (NASONEX) 50 MCG/ACT nasal spray Place 2 sprays into the nose daily.     . montelukast (SINGULAIR) 10 MG tablet Take 10 mg by mouth at bedtime.    . Multiple Vitamin (MULITIVITAMIN WITH MINERALS) TABS Take 1 tablet by mouth daily.    Marland Kitchen omeprazole (PRILOSEC) 20 MG capsule Take 20 mg by mouth daily.    . predniSONE (STERAPRED UNI-PAK 21 TAB) 10 MG (21) TBPK tablet Take as directed (Patient not taking: Reported on 02/13/2019) 21 tablet 0  . promethazine (PHENERGAN) 25 MG tablet 1 TABLET UP TO 3 TIMES A DAY AS NEEDED FOR NAUSEA ORALLY 30 DAY(S)  1  . spironolactone (ALDACTONE) 50 MG tablet Take 50 mg by mouth daily. with food  5  . tretinoin (RETIN-A) 0.025 % cream 1 APPLICATION A PEARL-SIZED AMOUNT TO FACE IN THE EVENING ONCE A DAY EXTERNALLY 30 DAYS  5   No facility-administered medications prior to visit.     PAST MEDICAL HISTORY: Past Medical History:  Diagnosis Date  . Asthma   . GERD  (gastroesophageal reflux disease)   . Migraine   . Scoliosis   . TIA (transient ischemic attack) 01/2012   migraine variant, no clot but vessel spasm    PAST SURGICAL HISTORY: Past Surgical History:  Procedure Laterality Date  . CESAREAN SECTION  1997   boy    FAMILY HISTORY: Family History  Problem Relation Age of Onset  . Diabetes Father   . Hypertension Father   . Hyperlipidemia Father   . Cancer Father        prostate  . Heart attack Father        x 2  . Cancer Maternal Uncle        STOMACH  . Diabetes Maternal Uncle   . Cancer Maternal Grandfather        LUNG  .  Cancer Paternal Grandmother        stomach  . Cancer Paternal Grandfather        lung    SOCIAL HISTORY: Social History   Socioeconomic History  . Marital status: Legally Separated    Spouse name: Not on file  . Number of children: 1  . Years of education: some college  . Highest education level: Not on file  Occupational History    Comment: [employer], [employer]  Social Needs  . Financial resource strain: Not on file  . Food insecurity:    Worry: Not on file    Inability: Not on file  . Transportation needs:    Medical: Not on file    Non-medical: Not on file  Tobacco Use  . Smoking status: Former Smoker    Years: 10.00    Last attempt to quit: 12/12/2005    Years since quitting: 13.1  . Smokeless tobacco: Never Used  Substance and Sexual Activity  . Alcohol use: Yes    Alcohol/week: 0.0 standard drinks    Comment: 10/14/18 2-3 x month  . Drug use: No  . Sexual activity: Yes    Partners: Male    Birth control/protection: Rhythm, Condom    Comment: 1st intercourse 43 yo-More than 5 partners  Lifestyle  . Physical activity:    Days per week: Not on file    Minutes per session: Not on file  . Stress: Not on file  Relationships  . Social connections:    Talks on phone: Not on file    Gets together: Not on file    Attends religious service: Not on file    Active member of club or  organization: Not on file    Attends meetings of clubs or organizations: Not on file    Relationship status: Not on file  . Intimate partner violence:    Fear of current or ex partner: Not on file    Emotionally abused: Not on file    Physically abused: Not on file    Forced sexual activity: Not on file  Other Topics Concern  . Not on file  Social History Narrative   Lives alone   Caffeine- quit 01/2012     PHYSICAL EXAM   Telephone visit     DIAGNOSTIC DATA (LABS, IMAGING, TESTING) - I reviewed patient records, labs, notes, testing and imaging myself where available.  Lab Results  Component Value Date   WBC 8.6 12/30/2018   HGB 13.1 12/30/2018   HCT 39.3 12/30/2018   MCV 83.3 12/30/2018   PLT 257 12/30/2018      Component Value Date/Time   NA 139 12/30/2018 1442   K 3.8 12/30/2018 1442   CL 100 12/30/2018 1442   CO2 30 12/30/2018 1442   GLUCOSE 78 12/30/2018 1442   BUN 15 12/30/2018 1442   CREATININE 0.82 12/30/2018 1442   CALCIUM 9.6 12/30/2018 1442   PROT 6.9 12/30/2018 1442   ALBUMIN 3.7 10/10/2015 0943   AST 17 12/30/2018 1442   ALT 18 12/30/2018 1442   ALKPHOS 41 10/10/2015 0943   BILITOT 0.3 12/30/2018 1442   GFRNONAA >60 01/30/2017 2306   GFRAA >60 01/30/2017 2306   Lab Results  Component Value Date   CHOL 212 (H) 01/04/2019   HDL 70 01/04/2019   LDLCALC 117 (H) 01/04/2019   TRIG 135 01/04/2019   CHOLHDL 3.0 01/04/2019   Lab Results  Component Value Date   HGBA1C 5.1 02/15/2012   No results found  for: [id number] Lab Results  Component Value Date   TSH 1.333 02/14/2013    02/14/12 CTA head / neck - negative  02/16/12 MRI brain - Negative noncontrast MRI appearance the brain allowing for artifact from dental braces.  02/15/12 TTE  - No cardiac source of emboli was identified.    ASSESSMENT AND PLAN  43 y.o. year old female here with migraine without aura.  Also with remote history of transient left-sided numbness, possibly TIA  versus complicated migraine.  No significant vascular risk factors found.  More likely represents complicate a migraine.   Dx:  1. Migraine without aura and without status migrainosus, not intractable       PLAN:   Virtual Visit via Telephone Note  I connected with [name] [name] on 02/14/19 at 10:30 AM EDT by telephone and verified that I am speaking with the correct person using two identifiers.   I discussed the limitations, risks, security and privacy concerns of performing an evaluation and management service by telephone and the availability of in person appointments. I also discussed with the patient that there may be a patient responsible charge related to this service. The patient expressed understanding and agreed to proceed.   Assessment and Plan:  - continue emgality and rizatriptan  Meds ordered this encounter  Medications  . rizatriptan (MAXALT-MLT) 10 MG disintegrating tablet    Sig: Take 1 tablet (10 mg total) by mouth as needed for migraine. May repeat in 2 hours if needed    Dispense:  9 tablet    Refill:  11  . Galcanezumab-gnlm (EMGALITY) 120 MG/ML SOAJ    Sig: Inject 120 mg into the skin every 30 (thirty) days.    Dispense:  3 pen    Refill:  4   - To prevent or relieve headaches, try the following:   Cool Compress. Lie down and place a cool compress on your head.   Avoid headache triggers. If certain foods or odors seem to have triggered your migraines in the past, avoid them. A headache diary might help you identify triggers.   Include physical activity in your daily routine.   Manage stress. Find healthy ways to cope with the stressors, such as delegating tasks on your to-do list.   Practice relaxation techniques. Try deep breathing, yoga, massage and visualization.   Eat regularly. Eating regularly scheduled meals and maintaining a healthy diet might help prevent headaches. Also, drink plenty of fluids.   Follow a regular sleep schedule.  Sleep deprivation might contribute to headaches  Consider biofeedback. With this mind-body technique, you learn to control certain bodily functions - such as muscle tension, heart rate and blood pressure - to prevent headaches or reduce headache pain.   Follow Up Instructions:  - follow up in 9 months   I discussed the assessment and treatment plan with the patient. The patient was provided an opportunity to ask questions and all were answered. The patient agreed with the plan and demonstrated an understanding of the instructions.   The patient was advised to call back or seek an in-person evaluation if the symptoms worsen or if the condition fails to improve as anticipated.  I provided 7 minutes of non-face-to-face time during this encounter.   Suanne MarkerVIKRAM R. Deondrae Mcgrail, MD 02/14/2019, 10:50 AM Certified in Neurology, Neurophysiology and Neuroimaging  [facility] [address], [address] [place], [place] [address] [phone]

## 2019-02-21 ENCOUNTER — Telehealth: Payer: Self-pay | Admitting: *Deleted

## 2019-02-21 NOTE — Telephone Encounter (Signed)
Follow up in 9 months

## 2019-03-06 DIAGNOSIS — J309 Allergic rhinitis, unspecified: Secondary | ICD-10-CM | POA: Diagnosis not present

## 2019-03-13 ENCOUNTER — Encounter: Payer: Self-pay | Admitting: Gynecology

## 2019-03-13 ENCOUNTER — Ambulatory Visit: Payer: 59 | Admitting: Gynecology

## 2019-03-13 ENCOUNTER — Other Ambulatory Visit: Payer: Self-pay

## 2019-03-13 VITALS — BP 118/74

## 2019-03-13 DIAGNOSIS — B9689 Other specified bacterial agents as the cause of diseases classified elsewhere: Secondary | ICD-10-CM

## 2019-03-13 DIAGNOSIS — N760 Acute vaginitis: Secondary | ICD-10-CM

## 2019-03-13 MED ORDER — METRONIDAZOLE 500 MG PO TABS: 500.0000 mg | ORAL_TABLET | Freq: Two times a day (BID) | ORAL | 0 refills | Status: DC

## 2019-03-13 NOTE — Progress Notes (Signed)
    [name] [name] [birth year] [id number]        43 y.o.  G2P1011 presents with 2-week history of vaginal discharge.  Initially had discharge and irritation and used OTC antifungal.  She is no longer having irritation but a very profuse watery discharge.  No odor.  No urinary symptoms such as frequency dysuria urgency low back pain fever or chills.  Past medical history,surgical history, problem list, medications, allergies, family history and social history were all reviewed and documented in the EPIC chart.  Directed ROS with pertinent positives and negatives documented in the history of present illness/assessment and plan.  Exam: [name] assistant Vitals:   03/13/19 1307  BP: 118/74   General appearance:  Normal Abdomen soft nontender without masses guarding rebound Pelvic external BUS vagina with white watery discharge.  Cervix normal.  Uterus grossly normal midline mobile nontender.  Adnexa without masses or tenderness.  Assessment/Plan:  43 y.o. G2P1011 prep overall was negative.  Her history and exam is consistent with a low-grade bacterial vaginosis.  Options for treatment reviewed.  Patient is going to take metronidazole 500 mg twice daily x7 days.  Alcohol avoidance reviewed.  Will follow-up if symptoms persist, worsen or recur.    [name] MD, 1:25 PM 03/13/2019

## 2019-03-13 NOTE — Patient Instructions (Signed)
Take the antibiotic pill twice daily for 7 days.  Avoid alcohol while taking.  Follow-up if your symptoms persist, worsen or recur.

## 2019-03-14 DIAGNOSIS — N760 Acute vaginitis: Secondary | ICD-10-CM | POA: Diagnosis not present

## 2019-03-14 DIAGNOSIS — B9689 Other specified bacterial agents as the cause of diseases classified elsewhere: Secondary | ICD-10-CM | POA: Diagnosis not present

## 2019-03-14 LAB — WET PREP FOR TRICH, YEAST, CLUE

## 2019-03-14 NOTE — Addendum Note (Signed)
Addended by: Dayna Barker on: 03/14/2019 08:33 AM   Modules accepted: Orders

## 2019-06-25 ENCOUNTER — Other Ambulatory Visit (INDEPENDENT_AMBULATORY_CARE_PROVIDER_SITE_OTHER): Payer: Self-pay | Admitting: Physician Assistant

## 2019-08-15 ENCOUNTER — Encounter: Payer: Self-pay | Admitting: Gynecology

## 2019-09-06 ENCOUNTER — Other Ambulatory Visit: Payer: Self-pay

## 2019-09-06 DIAGNOSIS — Z20822 Contact with and (suspected) exposure to covid-19: Secondary | ICD-10-CM

## 2019-09-08 LAB — NOVEL CORONAVIRUS, NAA: SARS-CoV-2, NAA: NOT DETECTED

## 2019-09-27 ENCOUNTER — Other Ambulatory Visit: Payer: Self-pay

## 2019-09-27 ENCOUNTER — Ambulatory Visit: Payer: 59 | Admitting: Diagnostic Neuroimaging

## 2019-09-27 ENCOUNTER — Encounter: Payer: Self-pay | Admitting: Diagnostic Neuroimaging

## 2019-09-27 VITALS — BP 124/67 | HR 82 | Temp 97.5°F | Ht 62.0 in | Wt 161.6 lb

## 2019-09-27 DIAGNOSIS — G43009 Migraine without aura, not intractable, without status migrainosus: Secondary | ICD-10-CM

## 2019-09-27 DIAGNOSIS — R200 Anesthesia of skin: Secondary | ICD-10-CM | POA: Diagnosis not present

## 2019-09-27 MED ORDER — RIZATRIPTAN BENZOATE 10 MG PO TBDP: 10.0000 mg | ORAL_TABLET | ORAL | 11 refills | Status: DC | PRN

## 2019-09-27 MED ORDER — EMGALITY 120 MG/ML ~~LOC~~ SOAJ: 120.0000 mg | SUBCUTANEOUS | 4 refills | Status: DC

## 2019-09-27 NOTE — Progress Notes (Signed)
[facility]  PATIENT: [name] [name] DOB: 05/22/1976  REFERRING CLINICIAN: N Redmon HISTORY FROM: patient REASON FOR VISIT: follow up    HISTORICAL  CHIEF COMPLAINT:  Chief Complaint  Patient presents with  . Migraine    rm 6, one yr FU, "big improvement in migraines, 3-4 monthly, Rizatriptan phenergan help"    HISTORY OF PRESENT ILLNESS:   UPDATE (09/27/19, VRP): Since last visit, doing well on emgality. Symptoms are improved. Severity is mild. No alleviating or aggravating factors. Tolerating meds. Avg 2-3 HA per month now.  UPDATE (02/14/19, VRP): Since last visit, doing well. Tried TPX, but stopped due to side effects (tired, decr appetite). Now on emgality and doing well. Avg 2 HA per week. Rizatriptan helps.   NEW HPI 10/14/18: 43 year old female here for evaluation of migraines.  Patient previously seen in 2014.  Now patient having 3-4 headaches per week since July 2019.  He describes constant left-sided frontal and parietal throbbing pain with nausea and photophobia.  Some neck pain.  She has been prescribed diclofenac and promethazine with mild relief.  No specific triggering or aggravating factors.  No vision changes, blurred vision or vision loss.  No unilateral numbness or tingling.  UPDATE 01/02/13: Doing well. No new events of numbness. Has had 2 episodes of waking up at not, not feeling right, heart racing, and diff falling asleep again. Has new job (promotion) and is surrounded by more smokers.  UPDATE 06/14/12:  She denies any further episodes of numbness. She reports having a headache last week for 2 days, she took Ibuprofen 1 tab for relief.  She does report having a history of migraines in the past but has not taken any prophylactic medications.  They generally occur during her menses.  Tolerating ASA 81mg  well without bruising.  She is now exercising 4-5 days per week.    EEG 03/28/12 normal  Prolonged cardiac monitor 5/5-5/29/13 shows sinus rhythm,  occasional sinus bradycardia.  No sustained arrhythmia.  PRIOR HPI (03/15/12): 43 year old right handed female with a past medical history of controlled hyperlipidemia with diet and exercise, asthma, and GERD here for evaluation of 2 seperate episodes of left arm and left leg numbness in March 2013.  The first episode she was sitting eating at a restaraunt when she suddenly became numbness to the left side of her face arm and leg, she became disoriented, confused and felt like she could not breathe with nausea. She was unable to begin with her husband while he was talking to her. She then went to the emergency department and within the hour her symptoms have resolved.  She reports feeling lightheaded and fatigued after the episode. Denies headache, visual disturbances or loss of bowel bladder. She was admitted to the hospital for 3 days her CTA of head and neck, MRI of the brain were normal. TTE showed 55-65% EF, no source of embolus.      After being home for several days she had another episode with left facial and arm numbness for approximately half an hour while at work, her coworker says she was pale and gray.  She reports she had difficulty breathing at that time as well. She does report having a headache to the left side of her head afterwards.  She went to the emergency department and was sent home. Reports she has been eating and drinking well sleeps approximately 6-7 hours per night but awakens every 2 hours but goes back to sleep easily. Denies any fever, illnesses,  episodes of dj vu, staring spells, meningitis or encephalitis or family history of seizures. She exercises regularly at least 3 times a week but has not since her last episode due to being nervous.  She reports 2 days before her symptoms occurred she seen a chiropractor in which she had  left neck manipulation with increased muscle tension.  Denies that she has been under increased stress "out of the ordinary".    REVIEW OF SYSTEMS:  Full 14 system review of systems performed and negative with exception of: as per HPI.  ALLERGIES: Allergies  Allergen Reactions  . Lexapro [Escitalopram Oxalate] Other (See Comments)    Can't remember, rash?  Marland Kitchen Topamax [Topiramate]     Continued weight loss, drowsiness    HOME MEDICATIONS: Outpatient Medications Prior to Visit  Medication Sig Dispense Refill  . ADVAIR DISKUS 250-50 MCG/DOSE AEPB Inhale 1 puff into the lungs 2 (two) times daily.    Marland Kitchen ampicillin (PRINCIPEN) 500 MG capsule ONCE DAILY WITH FOOD DAILY BY MOUTH 90 DAYS  3  . aspirin 81 MG chewable tablet Chew 81 mg by mouth daily.    Marland Kitchen buPROPion (WELLBUTRIN XL) 150 MG 24 hr tablet 150 mg daily.  1  . Dapsone 7.5 % GEL APPLY DAILY TOPICALLY 30 DAYS    . DULoxetine (CYMBALTA) 60 MG capsule Take 60 mg by mouth daily.  1  . Galcanezumab-gnlm (EMGALITY) 120 MG/ML SOAJ Inject 120 mg into the skin every 30 (thirty) days. 3 pen 4  . levalbuterol (XOPENEX HFA) 45 MCG/ACT inhaler Inhale 2 puffs into the lungs every 4 (four) hours as needed. For asthma.    . methocarbamol (ROBAXIN) 500 MG tablet Take 1 tablet (500 mg total) by mouth at bedtime as needed for muscle spasms. 90 tablet 0  . mometasone (NASONEX) 50 MCG/ACT nasal spray Place 2 sprays into the nose daily.     . montelukast (SINGULAIR) 10 MG tablet Take 10 mg by mouth at bedtime.    . Multiple Vitamin (MULITIVITAMIN WITH MINERALS) TABS Take 1 tablet by mouth daily.    Marland Kitchen omeprazole (PRILOSEC) 20 MG capsule Take 20 mg by mouth daily.    . promethazine (PHENERGAN) 25 MG tablet 1 TABLET UP TO 3 TIMES A DAY AS NEEDED FOR NAUSEA ORALLY 30 DAY(S)  1  . rizatriptan (MAXALT-MLT) 10 MG disintegrating tablet Take 1 tablet (10 mg total) by mouth as needed for migraine. May repeat in 2 hours if needed 9 tablet 11  . tretinoin (RETIN-A) 0.025 % cream 1 APPLICATION A PEARL-SIZED AMOUNT TO FACE IN THE EVENING ONCE A DAY EXTERNALLY 30 DAYS  5  . metroNIDAZOLE (FLAGYL) 500 MG tablet Take 1  tablet (500 mg total) by mouth 2 (two) times daily. 14 tablet 0  . predniSONE (STERAPRED UNI-PAK 21 TAB) 10 MG (21) TBPK tablet Take as directed (Patient not taking: Reported on 02/13/2019) 21 tablet 0  . spironolactone (ALDACTONE) 50 MG tablet Take 50 mg by mouth daily. with food  5   No facility-administered medications prior to visit.     PAST MEDICAL HISTORY: Past Medical History:  Diagnosis Date  . Asthma   . GERD (gastroesophageal reflux disease)   . Migraine   . Scoliosis   . TIA (transient ischemic attack) 01/2012   migraine variant, no clot but vessel spasm    PAST SURGICAL HISTORY: Past Surgical History:  Procedure Laterality Date  . CESAREAN SECTION  1997   boy    FAMILY HISTORY: Family History  Problem  Relation Age of Onset  . Diabetes Father   . Hypertension Father   . Hyperlipidemia Father   . Cancer Father        prostate  . Heart attack Father        x 2  . Cancer Maternal Uncle        STOMACH  . Diabetes Maternal Uncle   . Cancer Maternal Grandfather        LUNG  . Cancer Paternal Grandmother        stomach  . Cancer Paternal Grandfather        lung    SOCIAL HISTORY: Social History   Socioeconomic History  . Marital status: Legally Separated    Spouse name: Not on file  . Number of children: 1  . Years of education: some college  . Highest education level: Not on file  Occupational History    Comment: [employer], [employer]  Social Needs  . Financial resource strain: Not on file  . Food insecurity    Worry: Not on file    Inability: Not on file  . Transportation needs    Medical: Not on file    Non-medical: Not on file  Tobacco Use  . Smoking status: Former Smoker    Years: 10.00    Quit date: 12/12/2005    Years since quitting: 13.8  . Smokeless tobacco: Never Used  Substance and Sexual Activity  . Alcohol use: Yes    Alcohol/week: 0.0 standard drinks    Comment: 10/14/18 2-3 x month  . Drug use: No  . Sexual activity: Yes     Partners: Male    Birth control/protection: Rhythm, Condom    Comment: 1st intercourse 43 yo-More than 5 partners  Lifestyle  . Physical activity    Days per week: Not on file    Minutes per session: Not on file  . Stress: Not on file  Relationships  . Social Herbalist on phone: Not on file    Gets together: Not on file    Attends religious service: Not on file    Active member of club or organization: Not on file    Attends meetings of clubs or organizations: Not on file    Relationship status: Not on file  . Intimate partner violence    Fear of current or ex partner: Not on file    Emotionally abused: Not on file    Physically abused: Not on file    Forced sexual activity: Not on file  Other Topics Concern  . Not on file  Social History Narrative   Lives alone   Caffeine- quit 01/2012     PHYSICAL EXAM  GENERAL EXAM/CONSTITUTIONAL: Vitals:  Vitals:   09/27/19 1342  BP: 124/67  Pulse: 82  Temp: (!) 97.5 F (36.4 C)  Weight: 161 lb 9.6 oz (73.3 kg)  Height: 5\' 2"  (1.575 m)     Body mass index is 29.56 kg/m. Wt Readings from Last 3 Encounters:  09/27/19 161 lb 9.6 oz (73.3 kg)  12/30/18 148 lb (67.1 kg)  10/14/18 149 lb (67.6 kg)     Patient is in no distress; well developed, nourished and groomed; neck is supple  CARDIOVASCULAR:  Examination of carotid arteries is normal; no carotid bruits  Regular rate and rhythm, no murmurs  Examination of peripheral vascular system by observation and palpation is normal  EYES:  Ophthalmoscopic exam of optic discs and posterior segments is normal; no papilledema or hemorrhages  No exam data present  MUSCULOSKELETAL:  Gait, strength, tone, movements noted in Neurologic exam below  NEUROLOGIC: MENTAL STATUS:  No flowsheet data found.  awake, alert, oriented to person, place and time  recent and remote memory intact  normal attention and concentration  language fluent, comprehension intact,  naming intact  fund of knowledge appropriate  CRANIAL NERVE:   2nd - no papilledema on fundoscopic exam  2nd, 3rd, 4th, 6th - pupils equal and reactive to light, visual fields full to confrontation, extraocular muscles intact, no nystagmus  5th - facial sensation symmetric  7th - facial strength symmetric  8th - hearing intact  9th - palate elevates symmetrically, uvula midline  11th - shoulder shrug symmetric  12th - tongue protrusion midline  MOTOR:   normal bulk and tone, full strength in the BUE, BLE  SENSORY:   normal and symmetric to light touch, pinprick, temperature, vibration  COORDINATION:   finger-nose-finger, fine finger movements normal  REFLEXES:   deep tendon reflexes present and symmetric  GAIT/STATION:   narrow based gait; able to walk on toes, heels and tandem; romberg is negative     DIAGNOSTIC DATA (LABS, IMAGING, TESTING) - I reviewed patient records, labs, notes, testing and imaging myself where available.  Lab Results  Component Value Date   WBC 8.6 12/30/2018   HGB 13.1 12/30/2018   HCT 39.3 12/30/2018   MCV 83.3 12/30/2018   PLT 257 12/30/2018      Component Value Date/Time   NA 139 12/30/2018 1442   K 3.8 12/30/2018 1442   CL 100 12/30/2018 1442   CO2 30 12/30/2018 1442   GLUCOSE 78 12/30/2018 1442   BUN 15 12/30/2018 1442   CREATININE 0.82 12/30/2018 1442   CALCIUM 9.6 12/30/2018 1442   PROT 6.9 12/30/2018 1442   ALBUMIN 3.7 10/10/2015 0943   AST 17 12/30/2018 1442   ALT 18 12/30/2018 1442   ALKPHOS 41 10/10/2015 0943   BILITOT 0.3 12/30/2018 1442   GFRNONAA >60 01/30/2017 2306   GFRAA >60 01/30/2017 2306   Lab Results  Component Value Date   CHOL 212 (H) 01/04/2019   HDL 70 01/04/2019   LDLCALC 117 (H) 01/04/2019   TRIG 135 01/04/2019   CHOLHDL 3.0 01/04/2019   Lab Results  Component Value Date   HGBA1C 5.1 02/15/2012   No results found for: VITAMINB12 Lab Results  Component Value Date   TSH 1.333  02/14/2013    02/14/12 CTA head / neck - negative  02/16/12 MRI brain - Negative noncontrast MRI appearance the brain allowing for artifact from dental braces.  02/15/12 TTE  - No cardiac source of emboli was identified.  11/22/18 MRI brain / MRA head  - normal   ASSESSMENT AND PLAN  43 y.o. year old female here with migraine without aura.  Also with remote history of transient left-sided numbness, possibly TIA versus complicated migraine.  No significant vascular risk factors found.  More likely represents complicate a migraine.   Dx:  1. Migraine without aura and without status migrainosus, not intractable   2. Numbness      PLAN:  - continue emgality and rizatriptan  - To prevent or relieve headaches, try the following:  Cool Compress. Lie down and place a cool compress on your head.   Avoid headache triggers. If certain foods or odors seem to have triggered your migraines in the past, avoid them. A headache diary might help you identify triggers.   Include physical activity in your  daily routine.   Manage stress. Find healthy ways to cope with the stressors, such as delegating tasks on your to-do list.   Practice relaxation techniques. Try deep breathing, yoga, massage and visualization.   Eat regularly. Eating regularly scheduled meals and maintaining a healthy diet might help prevent headaches. Also, drink plenty of fluids.   Follow a regular sleep schedule. Sleep deprivation might contribute to headaches  Consider biofeedback. With this mind-body technique, you learn to control certain bodily functions - such as muscle tension, heart rate and blood pressure - to prevent headaches or reduce headache pain.   Follow Up Instructions:  Return in about 1 year (around 09/26/2020).     Suanne Marker, MD 09/27/2019, 2:18 PM Certified in Neurology, Neurophysiology and Neuroimaging  [facility] [address] [place],  [place] [address] [phone]

## 2019-11-11 ENCOUNTER — Other Ambulatory Visit: Payer: Self-pay | Admitting: Diagnostic Neuroimaging

## 2020-01-01 ENCOUNTER — Encounter: Payer: 59 | Admitting: Obstetrics and Gynecology

## 2020-01-05 ENCOUNTER — Encounter: Payer: 59 | Admitting: Obstetrics and Gynecology

## 2020-02-01 ENCOUNTER — Other Ambulatory Visit: Payer: Self-pay

## 2020-02-01 ENCOUNTER — Encounter: Payer: Self-pay | Admitting: Obstetrics and Gynecology

## 2020-02-01 ENCOUNTER — Ambulatory Visit (INDEPENDENT_AMBULATORY_CARE_PROVIDER_SITE_OTHER): Payer: 59 | Admitting: Obstetrics and Gynecology

## 2020-02-01 VITALS — BP 122/76 | Ht 62.0 in | Wt 152.0 lb

## 2020-02-01 DIAGNOSIS — Z13220 Encounter for screening for lipoid disorders: Secondary | ICD-10-CM | POA: Diagnosis not present

## 2020-02-01 DIAGNOSIS — Z01419 Encounter for gynecological examination (general) (routine) without abnormal findings: Secondary | ICD-10-CM | POA: Diagnosis not present

## 2020-02-01 DIAGNOSIS — E7800 Pure hypercholesterolemia, unspecified: Secondary | ICD-10-CM

## 2020-02-01 NOTE — Progress Notes (Signed)
[name] [name] August 16, 1976 [id number]  SUBJECTIVE:  44 y.o. G2P1011 female for annual routine gynecologic exam. She has no gynecologic concerns.  Current Outpatient Medications  Medication Sig Dispense Refill  . ADVAIR DISKUS 250-50 MCG/DOSE AEPB Inhale 1 puff into the lungs 2 (two) times daily.    Marland Kitchen ampicillin (PRINCIPEN) 500 MG capsule ONCE DAILY WITH FOOD DAILY BY MOUTH 90 DAYS  3  . aspirin 81 MG chewable tablet Chew 81 mg by mouth daily.    Marland Kitchen buPROPion (WELLBUTRIN XL) 150 MG 24 hr tablet 150 mg daily.  1  . Dapsone 7.5 % GEL APPLY DAILY TOPICALLY 30 DAYS    . DULoxetine (CYMBALTA) 60 MG capsule Take 60 mg by mouth daily.  1  . Galcanezumab-gnlm (EMGALITY) 120 MG/ML SOAJ Inject 120 mg into the skin every 30 (thirty) days. 3 pen 4  . levalbuterol (XOPENEX HFA) 45 MCG/ACT inhaler Inhale 2 puffs into the lungs every 4 (four) hours as needed. For asthma.    . methocarbamol (ROBAXIN) 500 MG tablet Take 1 tablet (500 mg total) by mouth at bedtime as needed for muscle spasms. 90 tablet 0  . mometasone (NASONEX) 50 MCG/ACT nasal spray Place 2 sprays into the nose daily.     . montelukast (SINGULAIR) 10 MG tablet Take 10 mg by mouth at bedtime.    . Multiple Vitamin (MULITIVITAMIN WITH MINERALS) TABS Take 1 tablet by mouth daily.    Marland Kitchen omeprazole (PRILOSEC) 20 MG capsule Take 20 mg by mouth daily.    . promethazine (PHENERGAN) 25 MG tablet 1 TABLET UP TO 3 TIMES A DAY AS NEEDED FOR NAUSEA ORALLY 30 DAY(S)  1  . rizatriptan (MAXALT-MLT) 10 MG disintegrating tablet Take 1 tablet (10 mg total) by mouth as needed for migraine. May repeat in 2 hours if needed 9 tablet 11  . tretinoin (RETIN-A) 0.025 % cream 1 APPLICATION A PEARL-SIZED AMOUNT TO FACE IN THE EVENING ONCE A DAY EXTERNALLY 30 DAYS  5   No current facility-administered medications for this visit.   Allergies: Lexapro [escitalopram oxalate] and Topamax [topiramate]  Patient's last menstrual period was 01/20/2020.  Past medical  history,surgical history, problem list, medications, allergies, family history and social history were all reviewed and documented as reviewed in the EPIC chart.  ROS:  Feeling well. No dyspnea or chest pain on exertion.  No abdominal pain, change in bowel habits, black or bloody stools.  No urinary tract symptoms. GYN ROS: normal menses (chronically irregular, getting more regular with age), no abnormal bleeding, pelvic pain or discharge, no breast pain or new or enlarging lumps on self exam. No neurological complaints.   OBJECTIVE:  BP 122/76   Ht 5\' 2"  (1.575 m)   Wt 152 lb (68.9 kg)   LMP 01/20/2020   BMI 27.80 kg/m  The patient appears well, alert, oriented x 3, in no distress. ENT normal.  Neck supple. No cervical or supraclavicular adenopathy or thyromegaly.  Lungs are clear, good air entry, no wheezes, rhonchi or rales. S1 and S2 normal, no murmurs, regular rate and rhythm.  Abdomen soft without tenderness, guarding, mass or organomegaly.  Neurological is normal, no focal findings.  BREAST EXAM: breasts appear normal, no suspicious masses, no skin or nipple changes or axillary nodes  PELVIC EXAM: VULVA: normal appearing vulva with no masses, tenderness or lesions, VAGINA: normal appearing vagina with normal color and discharge, no lesions, CERVIX: normal appearing cervix without discharge or lesions, UTERUS: uterus is normal size, shape, consistency and nontender,  ADNEXA: normal adnexa in size, nontender and no masses  Chaperone: Caryn Bee present during the examination  ASSESSMENT:  44 y.o. G2P1011 here for annual gynecologic exam  PLAN:   1. No menstrual or hormonal concerns.  2. Pap smear/HPV 2020.  Current screening guidelines calling for the 5-year Pap smear cytology and HPV cotest interval. 3. Contraception.  Using withdrawal method.  Understands that this is relatively high risk of her contraceptive failure versus hormonal contraception, with a history of a "mini  stroke" she should avoid estrogen-containing contraception.  Will think about her options and let us know if she decides.  4. Mammogram 10/2018.  She is aware that she is overdue for repeat, reminded to schedule.  Normal breast exam.  Encouraged breast self awareness and notify us of any concerning changes. 5. Health maintenance.  She will proceed to lab for routine screening blood work (lipids, CBC, CMP).  She does have a primary care provider that is aware of her cholesterol and LDL elevations, and we will see how things are looking this year with repeat labs.  Return annually or sooner, prn.  Joseph Pierini MD  02/01/20

## 2020-02-02 LAB — COMPREHENSIVE METABOLIC PANEL
AG Ratio: 1.8 (calc) (ref 1.0–2.5)
ALT: 15 U/L (ref 6–29)
AST: 15 U/L (ref 10–30)
Albumin: 4.2 g/dL (ref 3.6–5.1)
Alkaline phosphatase (APISO): 55 U/L (ref 31–125)
BUN: 14 mg/dL (ref 7–25)
CO2: 27 mmol/L (ref 20–32)
Calcium: 9.4 mg/dL (ref 8.6–10.2)
Chloride: 103 mmol/L (ref 98–110)
Creat: 0.76 mg/dL (ref 0.50–1.10)
Globulin: 2.3 g/dL (calc) (ref 1.9–3.7)
Glucose, Bld: 87 mg/dL (ref 65–99)
Potassium: 3.9 mmol/L (ref 3.5–5.3)
Sodium: 140 mmol/L (ref 135–146)
Total Bilirubin: 0.3 mg/dL (ref 0.2–1.2)
Total Protein: 6.5 g/dL (ref 6.1–8.1)

## 2020-02-02 LAB — CBC
HCT: 39 % (ref 35.0–45.0)
Hemoglobin: 12.6 g/dL (ref 11.7–15.5)
MCH: 27.2 pg (ref 27.0–33.0)
MCHC: 32.3 g/dL (ref 32.0–36.0)
MCV: 84.1 fL (ref 80.0–100.0)
MPV: 12.5 fL (ref 7.5–12.5)
Platelets: 221 10*3/uL (ref 140–400)
RBC: 4.64 10*6/uL (ref 3.80–5.10)
RDW: 12.2 % (ref 11.0–15.0)
WBC: 7.5 10*3/uL (ref 3.8–10.8)

## 2020-02-02 LAB — LIPID PANEL
Cholesterol: 250 mg/dL — ABNORMAL HIGH (ref <200)
HDL: 58 mg/dL (ref >=50)
LDL Cholesterol (Calc): 167 mg/dL (calc) — ABNORMAL HIGH
Non-HDL Cholesterol (Calc): 192 mg/dL (calc) — ABNORMAL HIGH (ref <130)
Total CHOL/HDL Ratio: 4.3 (calc) (ref <5.0)
Triglycerides: 121 mg/dL (ref <150)

## 2020-02-14 ENCOUNTER — Telehealth: Payer: Self-pay | Admitting: *Deleted

## 2020-02-14 NOTE — Telephone Encounter (Addendum)
Emgality PA, key: [id number], G43.009 Failed topamax, maxalt, diclofenac, already taking wellbutrin.. [facility] processing request.

## 2020-02-14 NOTE — Telephone Encounter (Signed)
error 

## 2020-02-21 ENCOUNTER — Ambulatory Visit: Payer: 59 | Admitting: Physician Assistant

## 2020-02-28 ENCOUNTER — Ambulatory Visit: Payer: 59 | Admitting: Orthopaedic Surgery

## 2020-03-07 ENCOUNTER — Ambulatory Visit: Payer: 59 | Admitting: Orthopaedic Surgery

## 2020-03-07 ENCOUNTER — Ambulatory Visit: Payer: Self-pay

## 2020-03-07 ENCOUNTER — Encounter: Payer: Self-pay | Admitting: Orthopaedic Surgery

## 2020-03-07 ENCOUNTER — Other Ambulatory Visit: Payer: Self-pay

## 2020-03-07 DIAGNOSIS — M722 Plantar fascial fibromatosis: Secondary | ICD-10-CM | POA: Diagnosis not present

## 2020-03-07 NOTE — Progress Notes (Signed)
Office Visit Note   Patient: [name] [name]           Date of Birth: Jun 15, 1976           MRN: [id number] Visit Date: 03/07/2020              Requested by: Lennie Odor, PA [address] [place],  [place] [address] PCP: Lennie Odor, PA   Assessment & Plan: Visit Diagnoses:  1. Plantar fasciitis of left foot     Plan: Impression is left foot plantar fasciitis.  We have discussed treatment options to include continued use of orthotics, stretches and cortisone injection.  Patient will continue wearing her orthotics.  I provided her with an Achilles stretching program.  Have discussed with her that this can take several months to improve with stretching.  She would like to come in earlier for cortisone injection we can certainly do that.  She will let us know.  Follow-up with [place] as needed.  Follow-Up Instructions: Return if symptoms worsen or fail to improve.   Orders:  Orders Placed This Encounter  Procedures  . XR Os Calcis Left   No orders of the defined types were placed in this encounter.     Procedures: No procedures performed   Clinical Data: No additional findings.   Subjective: Chief Complaint  Patient presents with  . Left Foot - Pain    HEEL PAIN    HPI patient is a pleasant 44 year old female who comes in today with left heel pain for the past month.  No known injury or change in activity.  She does kickboxing run quite a bit but there has been no change in exercise surface, shoes or activity.  All of her pain is to the bottom of the heel.  Is worse first thing in the morning as well as when she is standing on her feet for period of time.  She has tried wearing full foot orthotics which do seem to help a little.  She has not tried any stretching.  Review of Systems as detailed in HPI.  All others reviewed and are negative.   Objective: Vital Signs: There were no vitals taken for this visit.  Physical Exam well-developed well-nourished female  no acute distress.  Alert and oriented x3.  Ortho Exam examination of her left heel reveals moderate tenderness at the plantar fascial insertion.  She can dorsiflex to about 15 degrees.  She is neurovascular intact distally.  Specialty Comments:  No specialty comments available.  Imaging: XR Os Calcis Left  Result Date: 03/07/2020 X-rays show a small spur at the plantar fascial insertion of the heel    PMFS History: Patient Active Problem List   Diagnosis Date Noted  . Cervicalgia 01/03/2019  . Migraine without aura and without status migrainosus, not intractable 10/14/2018  . GERD (gastroesophageal reflux disease) 02/14/2012  . Asthma    Past Medical History:  Diagnosis Date  . Asthma   . GERD (gastroesophageal reflux disease)   . Migraine   . Scoliosis   . TIA (transient ischemic attack) 01/2012   migraine variant, no clot but vessel spasm    Family History  Problem Relation Age of Onset  . Diabetes Father   . Hypertension Father   . Hyperlipidemia Father   . Cancer Father        prostate  . Heart attack Father        x 2  . Cancer Maternal Uncle  STOMACH  . Diabetes Maternal Uncle   . Cancer Maternal Grandfather        LUNG  . Cancer Paternal Grandmother        stomach  . Cancer Paternal Grandfather        lung    Past Surgical History:  Procedure Laterality Date  . CESAREAN SECTION  1997   boy   Social History   Occupational History    Comment: [employer], [employer]  Tobacco Use  . Smoking status: Former Smoker    Years: 10.00    Quit date: 12/12/2005    Years since quitting: 14.2  . Smokeless tobacco: Never Used  Substance and Sexual Activity  . Alcohol use: Yes    Alcohol/week: 0.0 standard drinks    Comment: 10/14/18 2-3 x month  . Drug use: No  . Sexual activity: Yes    Partners: Male    Birth control/protection: Rhythm, Condom    Comment: 1st intercourse 44 yo-More than 5 partners

## 2020-09-11 ENCOUNTER — Ambulatory Visit: Payer: 59 | Admitting: Orthopaedic Surgery

## 2020-09-11 ENCOUNTER — Encounter: Payer: Self-pay | Admitting: Orthopaedic Surgery

## 2020-09-11 DIAGNOSIS — M7711 Lateral epicondylitis, right elbow: Secondary | ICD-10-CM

## 2020-09-11 DIAGNOSIS — M7712 Lateral epicondylitis, left elbow: Secondary | ICD-10-CM

## 2020-09-11 MED ORDER — LIDOCAINE HCL 1 % IJ SOLN
1.0000 mL | INTRAMUSCULAR | Status: AC | PRN
  Administered 2020-09-11: 1 mL

## 2020-09-11 MED ORDER — METHYLPREDNISOLONE ACETATE 40 MG/ML IJ SUSP
40.0000 mg | INTRAMUSCULAR | Status: AC | PRN
  Administered 2020-09-11: 40 mg

## 2020-09-11 MED ORDER — BUPIVACAINE HCL 0.25 % IJ SOLN
0.3300 mL | INTRAMUSCULAR | Status: AC | PRN
  Administered 2020-09-11: .33 mL

## 2020-09-11 NOTE — Progress Notes (Signed)
Office Visit Note   Patient: [name] [name]           Date of Birth: [birth year]           MRN: [id number] Visit Date: 09/11/2020              Requested by: Milus Height, PA [address] [place],  [place] [address] PCP: Milus Height, PA   Assessment & Plan: Visit Diagnoses:  1. Bilateral tennis elbow     Plan: Impression is bilateral tennis elbow.  We have provided the patient with extensor stretches as well as tennis elbow straps for both sides.  We will proceed with right tennis elbow injection as this is the most symptomatic today.  Should her left elbow worsen she will come back in for injection.  Follow-Up Instructions: Return if symptoms worsen or fail to improve.   Orders:  Orders Placed This Encounter  Procedures  . Hand/UE Inj: R elbow   No orders of the defined types were placed in this encounter.     Procedures: Hand/UE Inj: R elbow for lateral epicondylitis on 09/11/2020 11:17 AM Indications: pain Details: 22 G needle Medications: 1 mL lidocaine 1 %; 0.33 mL bupivacaine 0.25 %; 40 mg methylPREDNISolone acetate 40 MG/ML      Clinical Data: No additional findings.   Subjective: Chief Complaint  Patient presents with  . Right Elbow - Pain    HPI patient is a pleasant 44 year old female who comes in today with bilateral elbow pain right greater than left for the past 3 weeks.  She has had no specific injury or change in activity but notes that she frequently throws a chocolate with her dog.  The pain she has is also lateral elbow worse when clicking her mouse or lifting anything.  She also has pain twisting open jars.  She has tried Tylenol without significant relief of symptoms.  Review of Systems as detailed in HPI.  All other reviewed and are negative.   Objective: Vital Signs: There were no vitals taken for this visit.  Physical Exam well-developed well-nourished female no acute distress.  Alert oriented x3.  Ortho Exam right elbow  exam shows marked tenderness to lateral epicondyle.  No tenderness to the radial tunnel.  She has increased pain with supination as well as resisted long finger extension.  She has pain with gripping as well.  Left elbow elicits the same symptoms but to a lesser extent.  She is neurovascular intact distally.  Specialty Comments:  No specialty comments available.  Imaging: No new imaging   PMFS History: Patient Active Problem List   Diagnosis Date Noted  . Cervicalgia 01/03/2019  . Migraine without aura and without status migrainosus, not intractable 10/14/2018  . GERD (gastroesophageal reflux disease) 02/14/2012  . Asthma    Past Medical History:  Diagnosis Date  . Asthma   . GERD (gastroesophageal reflux disease)   . Migraine   . Scoliosis   . TIA (transient ischemic attack) 01/2012   migraine variant, no clot but vessel spasm    Family History  Problem Relation Age of Onset  . Diabetes Father   . Hypertension Father   . Hyperlipidemia Father   . Cancer Father        prostate  . Heart attack Father        x 2  . Cancer Maternal Uncle        STOMACH  . Diabetes Maternal Uncle   . Cancer Maternal Grandfather  LUNG  . Cancer Paternal Grandmother        stomach  . Cancer Paternal Grandfather        lung    Past Surgical History:  Procedure Laterality Date  . CESAREAN SECTION  1997   boy   Social History   Occupational History    Comment: [employer], [employer]  Tobacco Use  . Smoking status: Former Smoker    Years: 10.00    Quit date: 12/12/2005    Years since quitting: 14.7  . Smokeless tobacco: Never Used  Vaping Use  . Vaping Use: Never used  Substance and Sexual Activity  . Alcohol use: Yes    Alcohol/week: 0.0 standard drinks    Comment: 10/14/18 2-3 x month  . Drug use: No  . Sexual activity: Yes    Partners: Male    Birth control/protection: Rhythm, Condom    Comment: 1st intercourse 44 yo-More than 5 partners

## 2020-09-30 ENCOUNTER — Ambulatory Visit: Payer: 59 | Admitting: Diagnostic Neuroimaging

## 2020-09-30 ENCOUNTER — Telehealth: Payer: Self-pay | Admitting: Diagnostic Neuroimaging

## 2020-09-30 ENCOUNTER — Encounter: Payer: Self-pay | Admitting: *Deleted

## 2020-09-30 NOTE — Telephone Encounter (Signed)
Pt. Called to cancel her appt. for this afternoon. She states she has the stomach flu. I offered her a mychart appt. & she declined as not feeling up to this appt.

## 2020-09-30 NOTE — Telephone Encounter (Addendum)
Noted, sent her my chart advising she reschedule, may do virtual visit and see NP or MD.

## 2020-10-08 ENCOUNTER — Other Ambulatory Visit: Payer: Self-pay | Admitting: Diagnostic Neuroimaging

## 2020-10-21 ENCOUNTER — Encounter: Payer: Self-pay | Admitting: Diagnostic Neuroimaging

## 2020-10-21 ENCOUNTER — Ambulatory Visit: Payer: 59 | Admitting: Diagnostic Neuroimaging

## 2020-10-21 VITALS — BP 112/67 | HR 79 | Ht 62.0 in | Wt 156.4 lb

## 2020-10-21 DIAGNOSIS — G43009 Migraine without aura, not intractable, without status migrainosus: Secondary | ICD-10-CM

## 2020-10-21 MED ORDER — RIZATRIPTAN BENZOATE 10 MG PO TBDP: 10.0000 mg | ORAL_TABLET | ORAL | 11 refills | Status: DC | PRN

## 2020-10-21 MED ORDER — EMGALITY 120 MG/ML ~~LOC~~ SOAJ: 120.0000 mg | SUBCUTANEOUS | 12 refills | Status: DC

## 2020-10-21 NOTE — Patient Instructions (Signed)
-   continue emgality and rizatriptan

## 2020-10-21 NOTE — Progress Notes (Signed)
[facility]  PATIENT: [name] [name] DOB: October 21, 1976  REFERRING CLINICIAN: N Redmon HISTORY FROM: patient REASON FOR VISIT: follow up    HISTORICAL  CHIEF COMPLAINT:  Chief Complaint  Patient presents with  . Migraine    rm 7, 1 year FU "emgality has changed my life, may use rescue every other month; still get nausea- use promethazine as needed from PCP"    HISTORY OF PRESENT ILLNESS:   UPDATE (10/21/20, VRP): Since last visit, doing well. Symptoms are improved. Avg 1 HA every other month. No alleviating or aggravating factors. Tolerating meds.  UPDATE (09/27/19, VRP): Since last visit, doing well on emgality. Symptoms are improved. Severity is mild. No alleviating or aggravating factors. Tolerating meds. Avg 2-3 HA per month now.  UPDATE (02/14/19, VRP): Since last visit, doing well. Tried TPX, but stopped due to side effects (tired, decr appetite). Now on emgality and doing well. Avg 2 HA per week. Rizatriptan helps.   NEW HPI 10/14/18: 44 year old female here for evaluation of migraines.  Patient previously seen in 2014.  Now patient having 3-4 headaches per week since July 2019.  He describes constant left-sided frontal and parietal throbbing pain with nausea and photophobia.  Some neck pain.  She has been prescribed diclofenac and promethazine with mild relief.  No specific triggering or aggravating factors.  No vision changes, blurred vision or vision loss.  No unilateral numbness or tingling.  UPDATE 01/02/13: Doing well. No new events of numbness. Has had 2 episodes of waking up at not, not feeling right, heart racing, and diff falling asleep again. Has new job (promotion) and is surrounded by more smokers.  UPDATE 06/14/12:  She denies any further episodes of numbness. She reports having a headache last week for 2 days, she took Ibuprofen 1 tab for relief.  She does report having a history of migraines in the past but has not taken any prophylactic medications.   They generally occur during her menses.  Tolerating ASA 81mg  well without bruising.  She is now exercising 4-5 days per week.    EEG 03/28/12 normal  Prolonged cardiac monitor 5/5-5/29/13 shows sinus rhythm, occasional sinus bradycardia.  No sustained arrhythmia.  PRIOR HPI (03/15/12): 44 year old right handed female with a past medical history of controlled hyperlipidemia with diet and exercise, asthma, and GERD here for evaluation of 2 seperate episodes of left arm and left leg numbness in March 2013.  The first episode she was sitting eating at a restaraunt when she suddenly became numbness to the left side of her face arm and leg, she became disoriented, confused and felt like she could not breathe with nausea. She was unable to begin with her husband while he was talking to her. She then went to the emergency department and within the hour her symptoms have resolved.  She reports feeling lightheaded and fatigued after the episode. Denies headache, visual disturbances or loss of bowel bladder. She was admitted to the hospital for 3 days her CTA of head and neck, MRI of the brain were normal. TTE showed 55-65% EF, no source of embolus.      After being home for several days she had another episode with left facial and arm numbness for approximately half an hour while at work, her coworker says she was pale and gray.  She reports she had difficulty breathing at that time as well. She does report having a headache to the left side of her head afterwards.  She went to  the emergency department and was sent home. Reports she has been eating and drinking well sleeps approximately 6-7 hours per night but awakens every 2 hours but goes back to sleep easily. Denies any fever, illnesses, episodes of dj vu, staring spells, meningitis or encephalitis or family history of seizures. She exercises regularly at least 3 times a week but has not since her last episode due to being nervous.  She reports 2 days before her  symptoms occurred she seen a chiropractor in which she had  left neck manipulation with increased muscle tension.  Denies that she has been under increased stress "out of the ordinary".    REVIEW OF SYSTEMS: Full 14 system review of systems performed and negative with exception of: as per HPI.  ALLERGIES: Allergies  Allergen Reactions  . Lexapro [Escitalopram Oxalate] Other (See Comments)    Can't remember, rash?  Marland Kitchen Topamax [Topiramate]     Continued weight loss, drowsiness    HOME MEDICATIONS: Outpatient Medications Prior to Visit  Medication Sig Dispense Refill  . ADVAIR DISKUS 250-50 MCG/DOSE AEPB Inhale 1 puff into the lungs 2 (two) times daily.    Marland Kitchen ampicillin (PRINCIPEN) 500 MG capsule ONCE DAILY WITH FOOD DAILY BY MOUTH 90 DAYS  3  . aspirin 81 MG chewable tablet Chew 81 mg by mouth daily.    Marland Kitchen buPROPion (WELLBUTRIN XL) 150 MG 24 hr tablet 150 mg daily.  1  . Dapsone 7.5 % GEL APPLY DAILY TOPICALLY 30 DAYS    . DULoxetine (CYMBALTA) 60 MG capsule Take 60 mg by mouth daily.  1  . EMGALITY 120 MG/ML SOAJ INJECT 120 MG INTO THE SKIN EVERY 30 (THIRTY) DAYS. 1 mL 12  . levalbuterol (XOPENEX HFA) 45 MCG/ACT inhaler Inhale 2 puffs into the lungs every 4 (four) hours as needed. For asthma.    . methocarbamol (ROBAXIN) 500 MG tablet Take 1 tablet (500 mg total) by mouth at bedtime as needed for muscle spasms. 90 tablet 0  . mometasone (NASONEX) 50 MCG/ACT nasal spray Place 2 sprays into the nose daily.     . montelukast (SINGULAIR) 10 MG tablet Take 10 mg by mouth at bedtime.    . Multiple Vitamin (MULITIVITAMIN WITH MINERALS) TABS Take 1 tablet by mouth daily.    Marland Kitchen omeprazole (PRILOSEC) 20 MG capsule Take 20 mg by mouth daily.    . promethazine (PHENERGAN) 25 MG tablet 1 TABLET UP TO 3 TIMES A DAY AS NEEDED FOR NAUSEA ORALLY 30 DAY(S)  1  . rizatriptan (MAXALT-MLT) 10 MG disintegrating tablet Take 1 tablet (10 mg total) by mouth as needed for migraine. May repeat in 2 hours if needed 9  tablet 11  . tretinoin (RETIN-A) 0.025 % cream 1 APPLICATION A PEARL-SIZED AMOUNT TO FACE IN THE EVENING ONCE A DAY EXTERNALLY 30 DAYS  5   No facility-administered medications prior to visit.    PAST MEDICAL HISTORY: Past Medical History:  Diagnosis Date  . Asthma   . GERD (gastroesophageal reflux disease)   . Migraine   . Scoliosis   . TIA (transient ischemic attack) 01/2012   migraine variant, no clot but vessel spasm    PAST SURGICAL HISTORY: Past Surgical History:  Procedure Laterality Date  . CESAREAN SECTION  1997   boy    FAMILY HISTORY: Family History  Problem Relation Age of Onset  . Diabetes Father   . Hypertension Father   . Hyperlipidemia Father   . Cancer Father  prostate  . Heart attack Father        x 2  . Cancer Maternal Uncle        STOMACH  . Diabetes Maternal Uncle   . Cancer Maternal Grandfather        LUNG  . Cancer Paternal Grandmother        stomach  . Cancer Paternal Grandfather        lung    SOCIAL HISTORY: Social History   Socioeconomic History  . Marital status: Legally Separated    Spouse name: Not on file  . Number of children: 1  . Years of education: some college  . Highest education level: Not on file  Occupational History    Comment: [employer], [employer]  Tobacco Use  . Smoking status: Former Smoker    Years: 10.00    Quit date: 12/12/2005    Years since quitting: 14.8  . Smokeless tobacco: Never Used  Vaping Use  . Vaping Use: Never used  Substance and Sexual Activity  . Alcohol use: Yes    Alcohol/week: 0.0 standard drinks    Comment: 10/14/18 2-3 x month  . Drug use: No  . Sexual activity: Yes    Partners: Male    Birth control/protection: Rhythm, Condom    Comment: 1st intercourse 44 yo-More than 5 partners  Other Topics Concern  . Not on file  Social History Narrative   Lives alone   Caffeine- quit 01/2012   Social Determinants of Health   Financial Resource Strain:   . Difficulty of Paying  Living Expenses: Not on file  Food Insecurity:   . Worried About Programme researcher, broadcasting/film/videounning Out of Food in the Last Year: Not on file  . Ran Out of Food in the Last Year: Not on file  Transportation Needs:   . Lack of Transportation (Medical): Not on file  . Lack of Transportation (Non-Medical): Not on file  Physical Activity:   . Days of Exercise per Week: Not on file  . Minutes of Exercise per Session: Not on file  Stress:   . Feeling of Stress : Not on file  Social Connections:   . Frequency of Communication with Friends and Family: Not on file  . Frequency of Social Gatherings with Friends and Family: Not on file  . Attends Religious Services: Not on file  . Active Member of Clubs or Organizations: Not on file  . Attends BankerClub or Organization Meetings: Not on file  . Marital Status: Not on file  Intimate Partner Violence:   . Fear of Current or Ex-Partner: Not on file  . Emotionally Abused: Not on file  . Physically Abused: Not on file  . Sexually Abused: Not on file     PHYSICAL EXAM  GENERAL EXAM/CONSTITUTIONAL: Vitals:  Vitals:   10/21/20 1403  BP: 112/67  Pulse: 79  Weight: 156 lb 6.4 oz (70.9 kg)  Height: 5\' 2"  (1.575 m)   Body mass index is 28.61 kg/m. Wt Readings from Last 3 Encounters:  10/21/20 156 lb 6.4 oz (70.9 kg)  02/01/20 152 lb (68.9 kg)  09/27/19 161 lb 9.6 oz (73.3 kg)    Patient is in no distress; well developed, nourished and groomed; neck is supple  CARDIOVASCULAR:  Examination of carotid arteries is normal; no carotid bruits  Regular rate and rhythm, no murmurs  Examination of peripheral vascular system by observation and palpation is normal  EYES:  Ophthalmoscopic exam of optic discs and posterior segments is normal; no papilledema or  hemorrhages No exam data present  MUSCULOSKELETAL:  Gait, strength, tone, movements noted in Neurologic exam below  NEUROLOGIC: MENTAL STATUS:  No flowsheet data found.  awake, alert, oriented to person, place  and time  recent and remote memory intact  normal attention and concentration  language fluent, comprehension intact, naming intact  fund of knowledge appropriate  CRANIAL NERVE:   2nd - no papilledema on fundoscopic exam  2nd, 3rd, 4th, 6th - pupils equal and reactive to light, visual fields full to confrontation, extraocular muscles intact, no nystagmus  5th - facial sensation symmetric  7th - facial strength symmetric  8th - hearing intact  9th - palate elevates symmetrically, uvula midline  11th - shoulder shrug symmetric  12th - tongue protrusion midline  MOTOR:   normal bulk and tone, full strength in the BUE, BLE  SENSORY:   normal and symmetric to light touch, pinprick, temperature, vibration  COORDINATION:   finger-nose-finger, fine finger movements normal  REFLEXES:   deep tendon reflexes present and symmetric  GAIT/STATION:   narrow based gait     DIAGNOSTIC DATA (LABS, IMAGING, TESTING) - I reviewed patient records, labs, notes, testing and imaging myself where available.  Lab Results  Component Value Date   WBC 7.5 02/01/2020   HGB 12.6 02/01/2020   HCT 39.0 02/01/2020   MCV 84.1 02/01/2020   PLT 221 02/01/2020      Component Value Date/Time   NA 140 02/01/2020 1436   K 3.9 02/01/2020 1436   CL 103 02/01/2020 1436   CO2 27 02/01/2020 1436   GLUCOSE 87 02/01/2020 1436   BUN 14 02/01/2020 1436   CREATININE 0.76 02/01/2020 1436   CALCIUM 9.4 02/01/2020 1436   PROT 6.5 02/01/2020 1436   ALBUMIN 3.7 10/10/2015 0943   AST 15 02/01/2020 1436   ALT 15 02/01/2020 1436   ALKPHOS 41 10/10/2015 0943   BILITOT 0.3 02/01/2020 1436   GFRNONAA >60 01/30/2017 2306   GFRAA >60 01/30/2017 2306   Lab Results  Component Value Date   CHOL 250 (H) 02/01/2020   HDL 58 02/01/2020   LDLCALC 167 (H) 02/01/2020   TRIG 121 02/01/2020   CHOLHDL 4.3 02/01/2020   Lab Results  Component Value Date   HGBA1C 5.1 02/15/2012   No results found for:  VITAMINB12 Lab Results  Component Value Date   TSH 1.333 02/14/2013    02/14/12 CTA head / neck - negative  02/16/12 MRI brain - Negative noncontrast MRI appearance the brain allowing for artifact from dental braces.  02/15/12 TTE  - No cardiac source of emboli was identified.  11/22/18 MRI brain / MRA head  - normal   ASSESSMENT AND PLAN  44 y.o. year old female here with migraine without aura.  Also with remote history of transient left-sided numbness, possibly TIA versus complicated migraine.  No significant vascular risk factors found.  More likely represents complicate a migraine.   Dx:  1. Migraine without aura and without status migrainosus, not intractable      PLAN:  - continue emgality and rizatriptan  - To prevent or relieve headaches, try the following:  Cool Compress. Lie down and place a cool compress on your head.   Avoid headache triggers. If certain foods or odors seem to have triggered your migraines in the past, avoid them. A headache diary might help you identify triggers.   Include physical activity in your daily routine.   Manage stress. Find healthy ways to cope with the stressors,  such as delegating tasks on your to-do list.   Practice relaxation techniques. Try deep breathing, yoga, massage and visualization.   Eat regularly. Eating regularly scheduled meals and maintaining a healthy diet might help prevent headaches. Also, drink plenty of fluids.   Follow a regular sleep schedule. Sleep deprivation might contribute to headaches  Consider biofeedback. With this mind-body technique, you learn to control certain bodily functions - such as muscle tension, heart rate and blood pressure - to prevent headaches or reduce headache pain.   Meds ordered this encounter  Medications  . Galcanezumab-gnlm (EMGALITY) 120 MG/ML SOAJ    Sig: Inject 120 mg into the skin every 30 (thirty) days.    Dispense:  1 mL    Refill:  12  . rizatriptan  (MAXALT-MLT) 10 MG disintegrating tablet    Sig: Take 1 tablet (10 mg total) by mouth as needed for migraine. May repeat in 2 hours if needed    Dispense:  9 tablet    Refill:  11     Follow Up Instructions:  Return in about 1 year (around 10/21/2021) for with NP (Amy Lomax), virtual visit (15 min).     Suanne Marker, MD 10/21/2020, 2:27 PM Certified in Neurology, Neurophysiology and Neuroimaging  [facility] [address] [place], [place] [address] [phone]

## 2020-10-25 ENCOUNTER — Encounter: Payer: Self-pay | Admitting: Orthopaedic Surgery

## 2020-10-25 ENCOUNTER — Ambulatory Visit: Payer: 59 | Admitting: Orthopaedic Surgery

## 2020-10-25 ENCOUNTER — Other Ambulatory Visit: Payer: Self-pay

## 2020-10-25 DIAGNOSIS — M25521 Pain in right elbow: Secondary | ICD-10-CM

## 2020-10-25 MED ORDER — NAPROXEN 500 MG PO TABS: 500.0000 mg | ORAL_TABLET | Freq: Two times a day (BID) | ORAL | 3 refills | Status: DC

## 2020-10-25 NOTE — Progress Notes (Signed)
Office Visit Note   Patient: [name] [name]           Date of Birth: [birth year]           MRN: [id number] Visit Date: 10/25/2020              Requested by: Milus Height, PA [address] [place],  [place] [address] PCP: Milus Height, PA   Assessment & Plan: Visit Diagnoses:  1. Right elbow pain     Plan: Impression is continued right elbow lateral epicondylitis without any relief from the injection.  We will need to obtain MRI to rule out structural abnormalities.  In the meantime prescription for naproxen.  Follow-up after the MRI.  Follow-Up Instructions: Return if symptoms worsen or fail to improve.   Orders:  Orders Placed This Encounter  Procedures  . MR Elbow Right w/o contrast   Meds ordered this encounter  Medications  . naproxen (NAPROSYN) 500 MG tablet    Sig: Take 1 tablet (500 mg total) by mouth 2 (two) times daily with a meal.    Dispense:  30 tablet    Refill:  3      Procedures: No procedures performed   Clinical Data: No additional findings.   Subjective: No chief complaint on file.   [name] [name] returns today for continued right elbow pain.  Injection helped for a couple of days.  Counterforce brace is not helping either.  She has excruciating pain when holding a mouse.  She is using a Biofreeze and hemp creams and gels as well as Voltaren gel.   Review of Systems   Objective: Vital Signs: There were no vitals taken for this visit.  Physical Exam  Ortho Exam Right elbow shows point tenderness to the lateral epicondyle and the common extensor tendons.  Tender over the radial tunnel as well.  Pain with resisted long finger extension and wrist extension. Specialty Comments:  No specialty comments available.  Imaging: No results found.   PMFS History: Patient Active Problem List   Diagnosis Date Noted  . Cervicalgia 01/03/2019  . Migraine without aura and without status migrainosus, not intractable 10/14/2018  . GERD  (gastroesophageal reflux disease) 02/14/2012  . Asthma    Past Medical History:  Diagnosis Date  . Asthma   . GERD (gastroesophageal reflux disease)   . Migraine   . Scoliosis   . TIA (transient ischemic attack) 01/2012   migraine variant, no clot but vessel spasm    Family History  Problem Relation Age of Onset  . Diabetes Father   . Hypertension Father   . Hyperlipidemia Father   . Cancer Father        prostate  . Heart attack Father        x 2  . Cancer Maternal Uncle        STOMACH  . Diabetes Maternal Uncle   . Cancer Maternal Grandfather        LUNG  . Cancer Paternal Grandmother        stomach  . Cancer Paternal Grandfather        lung    Past Surgical History:  Procedure Laterality Date  . CESAREAN SECTION  1997   boy   Social History   Occupational History    Comment: [employer], [employer]  Tobacco Use  . Smoking status: Former Smoker    Years: 10.00    Quit date: 12/12/2005    Years since quitting: 14.8  . Smokeless tobacco:  Never Used  Vaping Use  . Vaping Use: Never used  Substance and Sexual Activity  . Alcohol use: Yes    Alcohol/week: 0.0 standard drinks    Comment: 10/14/18 2-3 x month  . Drug use: No  . Sexual activity: Yes    Partners: Male    Birth control/protection: Rhythm, Condom    Comment: 1st intercourse 44 yo-More than 5 partners

## 2020-11-26 ENCOUNTER — Ambulatory Visit
Admission: RE | Admit: 2020-11-26 | Discharge: 2020-11-26 | Disposition: A | Discharge status: Home / Self Care | Payer: 59 | Source: Ambulatory Visit | Attending: Orthopaedic Surgery | Admitting: Orthopaedic Surgery

## 2020-11-26 ENCOUNTER — Other Ambulatory Visit: Payer: Self-pay

## 2020-11-26 DIAGNOSIS — M25521 Pain in right elbow: Secondary | ICD-10-CM

## 2020-12-03 ENCOUNTER — Encounter: Payer: Self-pay | Admitting: Orthopaedic Surgery

## 2020-12-03 ENCOUNTER — Ambulatory Visit: Payer: 59 | Admitting: Orthopaedic Surgery

## 2020-12-03 DIAGNOSIS — M7711 Lateral epicondylitis, right elbow: Secondary | ICD-10-CM

## 2020-12-03 NOTE — Progress Notes (Signed)
Office Visit Note   Patient: [name] [name]           Date of Birth: 11-25-1975           MRN: [id number] Visit Date: 12/03/2020              Requested by: Milus Height, PA [address] [place],  [place] [address] PCP: Milus Height, PA   Assessment & Plan: Visit Diagnoses:  1. Lateral epicondylitis, right elbow     Plan: MRI shows a near full-thickness tear of the ECRB attachment.  These findings were reviewed with the patient in detail.  Given failure of conservative treatment in terms of rest, injections, bracing, stretches patient has elected to undergo surgical debridement and repair as indicated.  Risk benefits rehab recovery reviewed with the patient.  Questions encouraged and answered.  Follow-Up Instructions: Return for post-op.   Orders:  No orders of the defined types were placed in this encounter.  No orders of the defined types were placed in this encounter.     Procedures: No procedures performed   Clinical Data: No additional findings.   Subjective: Chief Complaint  Patient presents with  . Right Elbow - Pain    [name] [name] is here for MRI review of the right elbow.  Denies any changes.     Review of Systems   Objective: Vital Signs: There were no vitals taken for this visit.  Physical Exam  Ortho Exam Right elbow exam shows tenderness of the lateral epicondyle.  Pain that localizes to the lateral elbow with resisted long finger extension.  Radial tunnel is asymptomatic. Specialty Comments:  No specialty comments available.  Imaging: No results found.   PMFS History: Patient Active Problem List   Diagnosis Date Noted  . Cervicalgia 01/03/2019  . Migraine without aura and without status migrainosus, not intractable 10/14/2018  . GERD (gastroesophageal reflux disease) 02/14/2012  . Asthma    Past Medical History:  Diagnosis Date  . Asthma   . GERD (gastroesophageal reflux disease)   . Migraine   . Scoliosis   . TIA  (transient ischemic attack) 01/2012   migraine variant, no clot but vessel spasm    Family History  Problem Relation Age of Onset  . Diabetes Father   . Hypertension Father   . Hyperlipidemia Father   . Cancer Father        prostate  . Heart attack Father        x 2  . Cancer Maternal Uncle        STOMACH  . Diabetes Maternal Uncle   . Cancer Maternal Grandfather        LUNG  . Cancer Paternal Grandmother        stomach  . Cancer Paternal Grandfather        lung    Past Surgical History:  Procedure Laterality Date  . CESAREAN SECTION  1997   boy   Social History   Occupational History    Comment: [employer], [employer]  Tobacco Use  . Smoking status: Former Smoker    Years: 10.00    Quit date: 12/12/2005    Years since quitting: 14.9  . Smokeless tobacco: Never Used  Vaping Use  . Vaping Use: Never used  Substance and Sexual Activity  . Alcohol use: Yes    Alcohol/week: 0.0 standard drinks    Comment: 10/14/18 2-3 x month  . Drug use: No  . Sexual activity: Yes    Partners:  Male    Birth control/protection: Rhythm, Condom    Comment: 1st intercourse 45 yo-More than 5 partners

## 2020-12-24 ENCOUNTER — Telehealth: Payer: Self-pay | Admitting: Orthopaedic Surgery

## 2020-12-24 NOTE — Telephone Encounter (Signed)
[employer] forms received. Sent to [employer] 

## 2021-01-01 ENCOUNTER — Telehealth: Payer: Self-pay

## 2021-01-01 NOTE — Telephone Encounter (Signed)
Ok thanks 

## 2021-01-01 NOTE — Telephone Encounter (Signed)
Rescheduled surgery 2 weeks out due to patient being sick.  Tested negative for COVID.  She wanted me to relay that she doesn't like real strong pain medication and only wants a minimal amount after surgery.

## 2021-01-06 ENCOUNTER — Telehealth: Payer: Self-pay

## 2021-01-06 NOTE — Telephone Encounter (Signed)
Received disability form from [employer] and sent to [employer]

## 2021-01-13 ENCOUNTER — Other Ambulatory Visit: Payer: Self-pay | Admitting: Physician Assistant

## 2021-01-13 MED ORDER — ONDANSETRON HCL 4 MG PO TABS: 4.0000 mg | ORAL_TABLET | Freq: Three times a day (TID) | ORAL | 0 refills | Status: DC | PRN

## 2021-01-13 MED ORDER — HYDROCODONE-ACETAMINOPHEN 5-325 MG PO TABS: 1.0000 | ORAL_TABLET | Freq: Three times a day (TID) | ORAL | 0 refills | Status: DC | PRN

## 2021-01-16 ENCOUNTER — Encounter: Payer: Self-pay | Admitting: Orthopaedic Surgery

## 2021-01-16 DIAGNOSIS — M7711 Lateral epicondylitis, right elbow: Secondary | ICD-10-CM | POA: Diagnosis not present

## 2021-01-23 ENCOUNTER — Other Ambulatory Visit: Payer: Self-pay

## 2021-01-23 ENCOUNTER — Encounter: Payer: Self-pay | Admitting: Orthopaedic Surgery

## 2021-01-23 ENCOUNTER — Ambulatory Visit (INDEPENDENT_AMBULATORY_CARE_PROVIDER_SITE_OTHER): Payer: 59 | Admitting: Orthopaedic Surgery

## 2021-01-23 VITALS — Ht 62.0 in | Wt 156.0 lb

## 2021-01-23 DIAGNOSIS — M7711 Lateral epicondylitis, right elbow: Secondary | ICD-10-CM

## 2021-01-23 NOTE — Progress Notes (Signed)
   Post-Op Visit Note   Patient: [name] [name]           Date of Birth: [birth year]           MRN: [id number] Visit Date: 01/23/2021 PCP: Milus Height, PA   Assessment & Plan:  Chief Complaint:  Chief Complaint  Patient presents with  . Right Elbow - Follow-up    Right tennis elbow release 02/13/2021   Visit Diagnoses:  1. Lateral epicondylitis, right elbow     Plan:   [name] [name] is 1 week status post right tennis elbow release with repair of common extensor tendon.  Overall doing well.  She is noticing improvement.  Surgical incision is intact.  Steri-Strips in place.  No signs of infection.  Elbow range of motion is mildly limited as expected.  No motor or sensory deficits.  Elbow range of motion exercises demonstrated today she will work on these.  She needs to wear the wrist brace during activity and may take off at nighttime.  She needs to remain out of work for now.  We will reevaluate in 3 weeks.  Follow-Up Instructions: Return in about 3 weeks (around 02/13/2021).   Orders:  No orders of the defined types were placed in this encounter.  No orders of the defined types were placed in this encounter.   Imaging: No results found.  PMFS History: Patient Active Problem List   Diagnosis Date Noted  . Right tennis elbow 01/16/2021  . Cervicalgia 01/03/2019  . Migraine without aura and without status migrainosus, not intractable 10/14/2018  . GERD (gastroesophageal reflux disease) 02/14/2012  . Asthma    Past Medical History:  Diagnosis Date  . Asthma   . GERD (gastroesophageal reflux disease)   . Migraine   . Scoliosis   . TIA (transient ischemic attack) 01/2012   migraine variant, no clot but vessel spasm    Family History  Problem Relation Age of Onset  . Diabetes Father   . Hypertension Father   . Hyperlipidemia Father   . Cancer Father        prostate  . Heart attack Father        x 2  . Cancer Maternal Uncle        STOMACH  . Diabetes Maternal Uncle    . Cancer Maternal Grandfather        LUNG  . Cancer Paternal Grandmother        stomach  . Cancer Paternal Grandfather        lung    Past Surgical History:  Procedure Laterality Date  . CESAREAN SECTION  1997   boy   Social History   Occupational History    Comment: [employer], [employer]  Tobacco Use  . Smoking status: Former Smoker    Years: 10.00    Quit date: 12/12/2005    Years since quitting: 15.1  . Smokeless tobacco: Never Used  Vaping Use  . Vaping Use: Never used  Substance and Sexual Activity  . Alcohol use: Yes    Alcohol/week: 0.0 standard drinks    Comment: 10/14/18 2-3 x month  . Drug use: No  . Sexual activity: Yes    Partners: Male    Birth control/protection: Rhythm, Condom    Comment: 1st intercourse 45 yo-More than 5 partners

## 2021-02-03 ENCOUNTER — Encounter: Payer: 59 | Admitting: Obstetrics and Gynecology

## 2021-02-13 ENCOUNTER — Telehealth: Payer: Self-pay | Admitting: Physician Assistant

## 2021-02-13 ENCOUNTER — Ambulatory Visit (INDEPENDENT_AMBULATORY_CARE_PROVIDER_SITE_OTHER): Payer: 59 | Admitting: Physician Assistant

## 2021-02-13 DIAGNOSIS — M7711 Lateral epicondylitis, right elbow: Secondary | ICD-10-CM

## 2021-02-13 NOTE — Progress Notes (Signed)
Post-Op Visit Note   Patient: [name] [name]           Date of Birth: 1976-02-22           MRN: [id number] Visit Date: 02/13/2021 PCP: Milus Height, PA   Assessment & Plan:  Chief Complaint:  Chief Complaint  Patient presents with  . Right Elbow - Pain   Visit Diagnoses:  1. Right tennis elbow     Plan: Patient is a pleasant 45 year old female who comes in today 4 weeks out right tennis elbow release and extensor tendon repair.  She has been doing well.  She has been compliant wearing her wrist splint.  She has been working on exercises at home.  She does take Tylenol for pain.  Examination of her right elbow reveals mild swelling.  She does have a well-healing surgical incision with one stitch abscess to the proximal aspect.  No signs of infection or cellulitis.  Today, the stitch abscess was removed without complication.  We will go ahead and start her in outpatient physical therapy and an internal referral has been made.  She may wean out of her wrist splint over the next month but she no longer needs to wear this at night.  We will also allow her to start back work on April 11 working from home and no more than 6 hours a day.  She knows that she must wear her wrist splint while working.  No lifting more than a pound or 2.  She will follow up with [place] in 4 weeks time for recheck.  Call with concerns or questions.  Follow-Up Instructions: Return in about 4 weeks (around 03/13/2021).   Orders:  Orders Placed This Encounter  Procedures  . Ambulatory referral to Physical Therapy   No orders of the defined types were placed in this encounter.   Imaging: No results found.  PMFS History: Patient Active Problem List   Diagnosis Date Noted  . Right tennis elbow 01/16/2021  . Cervicalgia 01/03/2019  . Migraine without aura and without status migrainosus, not intractable 10/14/2018  . GERD (gastroesophageal reflux disease) 02/14/2012  . Asthma    Past Medical History:  Diagnosis  Date  . Asthma   . GERD (gastroesophageal reflux disease)   . Migraine   . Scoliosis   . TIA (transient ischemic attack) 01/2012   migraine variant, no clot but vessel spasm    Family History  Problem Relation Age of Onset  . Diabetes Father   . Hypertension Father   . Hyperlipidemia Father   . Cancer Father        prostate  . Heart attack Father        x 2  . Cancer Maternal Uncle        STOMACH  . Diabetes Maternal Uncle   . Cancer Maternal Grandfather        LUNG  . Cancer Paternal Grandmother        stomach  . Cancer Paternal Grandfather        lung    Past Surgical History:  Procedure Laterality Date  . CESAREAN SECTION  1997   boy   Social History   Occupational History    Comment: [employer], [employer]  Tobacco Use  . Smoking status: Former Smoker    Years: 10.00    Quit date: 12/12/2005    Years since quitting: 15.1  . Smokeless tobacco: Never Used  Vaping Use  . Vaping Use: Never used  Substance and  Sexual Activity  . Alcohol use: Yes    Alcohol/week: 0.0 standard drinks    Comment: 10/14/18 2-3 x month  . Drug use: No  . Sexual activity: Yes    Partners: Male    Birth control/protection: Rhythm, Condom    Comment: 1st intercourse 45 yo-More than 5 partners

## 2021-02-13 NOTE — Telephone Encounter (Signed)
Patient need another work for note need it to say 4 hours of work for 4 wks and one word is misspelling. But was misspelled. Please send update letter to mychart. Patient phone number is [phone].

## 2021-02-14 NOTE — Telephone Encounter (Signed)
Note done

## 2021-02-17 ENCOUNTER — Encounter: Payer: Self-pay | Admitting: Orthopaedic Surgery

## 2021-02-18 ENCOUNTER — Telehealth: Payer: Self-pay | Admitting: Orthopaedic Surgery

## 2021-02-18 NOTE — Telephone Encounter (Signed)
[employer] forms received. Sent to [employer] 

## 2021-02-21 ENCOUNTER — Ambulatory Visit (HOSPITAL_BASED_OUTPATIENT_CLINIC_OR_DEPARTMENT_OTHER): Payer: 59 | Attending: Physician Assistant | Admitting: Physical Therapy

## 2021-02-21 ENCOUNTER — Encounter (HOSPITAL_BASED_OUTPATIENT_CLINIC_OR_DEPARTMENT_OTHER): Payer: Self-pay | Admitting: Physical Therapy

## 2021-02-21 ENCOUNTER — Other Ambulatory Visit: Payer: Self-pay

## 2021-02-21 DIAGNOSIS — M25521 Pain in right elbow: Secondary | ICD-10-CM | POA: Diagnosis present

## 2021-02-21 DIAGNOSIS — M6281 Muscle weakness (generalized): Secondary | ICD-10-CM | POA: Diagnosis present

## 2021-02-21 NOTE — Therapy (Signed)
[facility] [address] [place], [place], [address] Phone: [phone]   Fax:  [phone]  Physical Therapy Evaluation  Patient Details  Name: [name] [name] MRN: [id number] Date of Birth: 1975-12-27 Referring Provider (PT): Jari Sportsman, [place]   Encounter Date: 02/21/2021   PT End of Session - 02/21/21 1443    Visit Number 1    Number of Visits 25    Date for PT Re-Evaluation 05/16/21    Authorization Type UHC    PT Start Time 1430    PT Stop Time 1514    PT Time Calculation (min) 44 min    Activity Tolerance Patient tolerated treatment well    Behavior During Therapy [facility] for tasks assessed/performed           Past Medical History:  Diagnosis Date  . Asthma   . GERD (gastroesophageal reflux disease)   . Migraine   . Scoliosis   . TIA (transient ischemic attack) 01/2012   migraine variant, no clot but vessel spasm    Past Surgical History:  Procedure Laterality Date  . CESAREAN SECTION  1997   boy    There were no vitals filed for this visit.    Subjective Assessment - 02/21/21 1435    Subjective Overall feeling ok. Returning to work on 4/11. Following restrictions. Occasional numbness but better.    Patient Stated Goals yoga, run, workout, boxing, walk dog    Currently in Pain? Yes   only with moving   Pain Location Elbow    Pain Orientation Right;Lateral    Pain Descriptors / Indicators Sore    Aggravating Factors  extension & rotation, typing    Pain Relieving Factors rest              [facility] PT Assessment - 02/21/21 0001      Assessment   Medical Diagnosis right tennis elbow release with repair of common extensor tendon    Referring Provider (PT) Jari Sportsman, PA-C    Onset Date/Surgical Date 01/16/21    Hand Dominance Right    Next MD Visit 4/21    Prior Therapy --      Precautions   Precaution Comments cannot lift more than a lb or 2      Balance Screen   Has the patient fallen in the  past 6 months No      Home Environment   Living Environment Private residence      Prior Function   Level of Independence Independent    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       [facility] Adult PT Treatment/Exercise - 02/21/21 0001      Exercises   Exercises  Elbow;Wrist      Wrist Exercises   Bar Weights/Barbell (Wrist Extension) 1 lb    Wrist Extension Limitations eccentric ext    Other wrist exercises extensor stretching, thumb opposition    Other wrist exercises yellow putty work                  PT Education - 02/21/21 1653    Education Details anatomy of condition, poc, hep, exercise form/rationale    Person(s) Educated Patient    Methods Explanation;Demonstration;Tactile cues;Verbal cues;Handout    Comprehension Verbalized understanding;Returned demonstration;Verbal cues required;Tactile cues required;Need further instruction            PT Short Term Goals - 02/21/21 1641      PT SHORT TERM GOAL #1   Title able to type for at least half day at work <=2/10 pain    Baseline significant and limiting at eval    Time 4    Period Weeks    Status New    Target Date 03/21/21      PT SHORT TERM GOAL #2   Title able to tolerance static, body weight CKC pressure through UEs    Baseline unable at eval    Time 4    Period Weeks    Status New    Target Date 03/21/21       PT SHORT TERM GOAL #3   Title pt will use Rt hand for light ADLs such as washing dishes and carrying small objects <=5lb comfortably    Baseline unable at eval    Time 4    Period Weeks    Status New    Target Date 03/21/21             PT Long Term Goals - 02/21/21 1643      PT LONG TERM GOAL #1   Title grip strength equal Rt to Lt    Baseline see flowsheet    Time 12    Period Weeks    Status New    Target Date 05/16/21      PT LONG TERM GOAL #2   Title pt will be able to hold her dog with use of Rt hand    Baseline unable at eval    Time 12    Period Weeks    Status New    Target Date 05/16/21      PT LONG TERM GOAL #3   Title pt will return to boxing with understanding of limitations & respect to keep pain <=2/10    Baseline unable at eval    Time 12    Period Weeks    Status New    Target Date 05/16/21      PT LONG TERM GOAL #4   Title pt will return to yoga and running without limitations by elbow pain    Baseline unable at eval    Time 12    Period Weeks    Status New    Target Date 05/16/21                  Plan - 02/21/21 1625    Clinical Impression Statement Pt presents to PT 1 mo s/p extensor tendon repair/tennis elbow. Overall she is doing very well but has not returned to work at this time (returns 4/11). 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 Advised that elbow will be sore and to do a few exercises throughout the day to  avoid too much soreness. Is planning to take an exam via computer, likely using a mouse. Advised that she could wear elbow compression with wrist brace for extra support. Pt will benefit from skilled PT to progress appropriately post op and reach long term functional goals.    Personal Factors and Comorbidities Past/Current Experience;Profession    Examination-Activity Limitations Bathing;Self Feeding;Sleep;Carry;Dressing;Lift    Examination-Participation  Restrictions Occupation;Cleaning;Driving;Other   exercise   Stability/Clinical Decision Making Stable/Uncomplicated    Clinical Decision Making Low    Rehab Potential Good    PT Frequency 2x / week   1/week for first 2 weeks, then to 2/week   PT Duration 12 weeks    PT Treatment/Interventions ADLs/Self Care Home Management;Cryotherapy;Electrical Stimulation;Functional mobility training;Moist Heat;Therapeutic activities;Therapeutic exercise;Neuromuscular re-education;Passive range of motion;Scar mobilization;Patient/family education;Manual techniques;Dry needling;Taping;Vasopneumatic Device    PT Next Visit Plan IASTM to extensor muscle bellies, cont strengthening per tolerance. limitations update?    PT Home Exercise Plan [id number], ice frequently through day    Consulted and Agree with Plan of Care Patient           Patient will benefit from skilled therapeutic intervention in order to improve the following deficits and impairments:  Impaired UE functional use,Increased muscle spasms,Decreased activity tolerance,Pain,Impaired flexibility,Decreased strength  Visit Diagnosis: Pain in right elbow - Plan: PT plan of care cert/re-cert  Muscle weakness (generalized) - Plan: PT plan of care cert/re-cert     Problem List Patient Active Problem List   Diagnosis Date Noted  . Right tennis elbow 01/16/2021  . Cervicalgia 01/03/2019  . Migraine without aura and without status migrainosus, not intractable 10/14/2018  . GERD (gastroesophageal reflux disease) 02/14/2012  . Asthma     Charlie Char C. Esterlene Atiyeh PT, DPT 02/21/21 4:57 PM   [facility] [address] [place], [place], [address] Phone: [phone]   Fax:  [phone]  Name: Jalayia Bagheri MRN: [id number] Date of Birth: 02/13/1976

## 2021-02-28 ENCOUNTER — Other Ambulatory Visit: Payer: Self-pay

## 2021-02-28 ENCOUNTER — Ambulatory Visit (HOSPITAL_BASED_OUTPATIENT_CLINIC_OR_DEPARTMENT_OTHER): Payer: 59 | Admitting: Physical Therapy

## 2021-02-28 ENCOUNTER — Encounter (HOSPITAL_BASED_OUTPATIENT_CLINIC_OR_DEPARTMENT_OTHER): Payer: Self-pay | Admitting: Physical Therapy

## 2021-02-28 DIAGNOSIS — M25521 Pain in right elbow: Secondary | ICD-10-CM

## 2021-02-28 DIAGNOSIS — M6281 Muscle weakness (generalized): Secondary | ICD-10-CM

## 2021-02-28 NOTE — Therapy (Signed)
[facility] [address] [place], [place], [address] Phone: [phone]   Fax:  [phone]  Physical Therapy Treatment  Patient Details  Name: [name] [name] MRN: [id number] Date of Birth: 11-30-1975 Referring Provider (PT): Jari Sportsman, [place]   Encounter Date: 02/28/2021   PT End of Session - 02/28/21 1427    Visit Number 2    Number of Visits 25    Date for PT Re-Evaluation 05/16/21    Authorization Type UHC    PT Start Time 1345    PT Stop Time 1426    PT Time Calculation (min) 41 min    Activity Tolerance Patient tolerated treatment well    Behavior During Therapy [facility] for tasks assessed/performed           Past Medical History:  Diagnosis Date  . Asthma   . GERD (gastroesophageal reflux disease)   . Migraine   . Scoliosis   . TIA (transient ischemic attack) 01/2012   migraine variant, no clot but vessel spasm    Past Surgical History:  Procedure Laterality Date  . CESAREAN SECTION  1997   boy    There were no vitals filed for this visit.   Subjective Assessment - 02/28/21 1348    Subjective A little sore today. Start back to work 1/2 days on Monday.    Patient Stated Goals yoga, run, workout, boxing, walk dog    Currently in Pain? Yes    Pain Location Elbow    Pain Orientation Right    Pain Descriptors / Indicators Sore                             [facility] Adult PT Treatment/Exercise - 02/28/21 0001      Exercises   Exercises Shoulder      Elbow Exercises   Other elbow exercises CKC weight bearing on table    Other elbow exercises OH ball press into wall; press + liftoff      Shoulder Exercises: Stretch   Other Shoulder Stretches upper trap & levator    Other Shoulder Stretches wide T chest stretch      Wrist Exercises   Wrist Extension Limitations eccentric extensor control with fingers open    Wrist Radial Deviation Limitations unweighted      Manual Therapy   Manual  Therapy Soft tissue mobilization    Soft tissue mobilization IASTM wrist extensors                    PT Short Term Goals - 02/21/21 1641      PT SHORT TERM GOAL #1   Title able to type for at least half day at work <=2/10 pain    Baseline significant and limiting at eval    Time 4    Period Weeks    Status New    Target Date 03/21/21      PT SHORT TERM GOAL #2   Title able to tolerance static, body weight CKC pressure through UEs    Baseline unable at eval    Time 4    Period Weeks    Status New    Target Date 03/21/21      PT SHORT TERM GOAL #3   Title pt will use Rt hand for light ADLs such as washing dishes and carrying small objects <=5lb comfortably    Baseline unable at eval    Time 4  Period Weeks    Status New    Target Date 03/21/21             PT Long Term Goals - 02/21/21 1643      PT LONG TERM GOAL #1   Title grip strength equal Rt to Lt    Baseline see flowsheet    Time 12    Period Weeks    Status New    Target Date 05/16/21      PT LONG TERM GOAL #2   Title pt will be able to hold her dog with use of Rt hand    Baseline unable at eval    Time 12    Period Weeks    Status New    Target Date 05/16/21      PT LONG TERM GOAL #3   Title pt will return to boxing with understanding of limitations & respect to keep pain <=2/10    Baseline unable at eval    Time 12    Period Weeks    Status New    Target Date 05/16/21      PT LONG TERM GOAL #4   Title pt will return to yoga and running without limitations by elbow pain    Baseline unable at eval    Time 12    Period Weeks    Status New    Target Date 05/16/21                 Plan - 02/28/21 1429    Clinical Impression Statement Soreness with CKC weight bearing but denied pain. Good activation with fatigue. BEgins work next week and will progress as toelrated.    PT Treatment/Interventions ADLs/Self Care Home Management;Cryotherapy;Electrical Stimulation;Functional  mobility training;Moist Heat;Therapeutic activities;Therapeutic exercise;Neuromuscular re-education;Passive range of motion;Scar mobilization;Patient/family education;Manual techniques;Dry needling;Taping;Vasopneumatic Device    PT Next Visit Plan manual PRN, cont CKC    PT Home Exercise Plan [id number], ice frequently through day    Consulted and Agree with Plan of Care Patient           Patient will benefit from skilled therapeutic intervention in order to improve the following deficits and impairments:  Impaired UE functional use,Increased muscle spasms,Decreased activity tolerance,Pain,Impaired flexibility,Decreased strength  Visit Diagnosis: Pain in right elbow  Muscle weakness (generalized)     Problem List Patient Active Problem List   Diagnosis Date Noted  . Right tennis elbow 01/16/2021  . Cervicalgia 01/03/2019  . Migraine without aura and without status migrainosus, not intractable 10/14/2018  . GERD (gastroesophageal reflux disease) 02/14/2012  . Asthma    Mairlyn Tegtmeyer C. Nadea Kirkland PT, DPT 02/28/21 2:31 PM   [facility] [address] [place], [place], [address] Phone: [phone]   Fax:  [phone]  Name: Shima Compere MRN: [id number] Date of Birth: July 08, 1976

## 2021-03-05 ENCOUNTER — Encounter (HOSPITAL_BASED_OUTPATIENT_CLINIC_OR_DEPARTMENT_OTHER): Payer: Self-pay | Admitting: Physical Therapy

## 2021-03-05 ENCOUNTER — Other Ambulatory Visit: Payer: Self-pay

## 2021-03-05 ENCOUNTER — Ambulatory Visit (HOSPITAL_BASED_OUTPATIENT_CLINIC_OR_DEPARTMENT_OTHER): Payer: 59 | Admitting: Physical Therapy

## 2021-03-05 DIAGNOSIS — M25521 Pain in right elbow: Secondary | ICD-10-CM | POA: Diagnosis not present

## 2021-03-05 DIAGNOSIS — M6281 Muscle weakness (generalized): Secondary | ICD-10-CM

## 2021-03-05 NOTE — Therapy (Signed)
[facility] [address] [place], [place], [address] Phone: [phone]   Fax:  [phone]  Physical Therapy Treatment  Patient Details  Name: [name] [name] MRN: [id number] Date of Birth: 08/16/1976 Referring Provider (PT): Jari Sportsman, [place]   Encounter Date: 03/05/2021   PT End of Session - 03/05/21 1624    Visit Number 3    Number of Visits 25    Date for PT Re-Evaluation 05/16/21    Authorization Type UHC    PT Start Time 1345    PT Stop Time 1429    PT Time Calculation (min) 44 min    Activity Tolerance Patient tolerated treatment well    Behavior During Therapy [facility] for tasks assessed/performed           Past Medical History:  Diagnosis Date  . Asthma   . GERD (gastroesophageal reflux disease)   . Migraine   . Scoliosis   . TIA (transient ischemic attack) 01/2012   migraine variant, no clot but vessel spasm    Past Surgical History:  Procedure Laterality Date  . CESAREAN SECTION  1997   boy    There were no vitals filed for this visit.   Subjective Assessment - 03/05/21 1348    Subjective Some soreness after going back to work but doing okay. Have to keep on compression brace when taking off wrist brace to use mouse.    Patient Stated Goals yoga, run, workout, boxing, walk dog    Currently in Pain? Yes    Pain Score 4     Pain Location Elbow    Pain Orientation Right    Pain Descriptors / Indicators Sore    Aggravating Factors  using computer mouse    Pain Relieving Factors brac              [facility] PT Assessment - 03/05/21 0001      Palpation   Palpation comment edema at lateral epicondyle                         [facility] Adult PT Treatment/Exercise - 03/05/21 0001      Modalities   Modalities Vasopneumatic      Vasopneumatic   Number Minutes Vasopneumatic  15 minutes    Vasopnuematic Location  --   elbow   Vasopneumatic Pressure Low    Vasopneumatic Temperature  34       Manual Therapy   Manual Therapy Passive ROM;Other (comment)    Edema Management over lateral epicondyle    Soft tissue mobilization IASTM wrist extensors    Passive ROM wrist flexion, digit flexion    Other Manual Therapy scar tissue mobs                    PT Short Term Goals - 02/21/21 1641      PT SHORT TERM GOAL #1   Title able to type for at least half day at work <=2/10 pain    Baseline significant and limiting at eval    Time 4    Period Weeks    Status New    Target Date 03/21/21      PT SHORT TERM GOAL #2   Title able to tolerance static, body weight CKC pressure through UEs    Baseline unable at eval    Time 4    Period Weeks    Status New    Target Date 03/21/21  PT SHORT TERM GOAL #3   Title pt will use Rt hand for light ADLs such as washing dishes and carrying small objects <=5lb comfortably    Baseline unable at eval    Time 4    Period Weeks    Status New    Target Date 03/21/21             PT Long Term Goals - 02/21/21 1643      PT LONG TERM GOAL #1   Title grip strength equal Rt to Lt    Baseline see flowsheet    Time 12    Period Weeks    Status New    Target Date 05/16/21      PT LONG TERM GOAL #2   Title pt will be able to hold her dog with use of Rt hand    Baseline unable at eval    Time 12    Period Weeks    Status New    Target Date 05/16/21      PT LONG TERM GOAL #3   Title pt will return to boxing with understanding of limitations & respect to keep pain <=2/10    Baseline unable at eval    Time 12    Period Weeks    Status New    Target Date 05/16/21      PT LONG TERM GOAL #4   Title pt will return to yoga and running without limitations by elbow pain    Baseline unable at eval    Time 12    Period Weeks    Status New    Target Date 05/16/21                 Plan - 03/05/21 1624    Clinical Impression Statement returned to both jobs part time with soreness noted. unable to wear wrist support  brace while using mouse so only using compression. Soreness in wrist extensor group and discussed keeping ipad low to avoid second digit extension when typing on screen. swelling noted to be significantly increased from last visit so we used vaso for ice and compression. beg to have some irritation on Left side from overuse- encouraged support brace on left elbow as well.    PT Treatment/Interventions ADLs/Self Care Home Management;Cryotherapy;Electrical Stimulation;Functional mobility training;Moist Heat;Therapeutic activities;Therapeutic exercise;Neuromuscular re-education;Passive range of motion;Scar mobilization;Patient/family education;Manual techniques;Dry needling;Taping;Vasopneumatic Device    PT Next Visit Plan manual PRN, cont CKC    PT Home Exercise Plan [id number], ice frequently through day    Consulted and Agree with Plan of Care Patient           Patient will benefit from skilled therapeutic intervention in order to improve the following deficits and impairments:  Impaired UE functional use,Increased muscle spasms,Decreased activity tolerance,Pain,Impaired flexibility,Decreased strength  Visit Diagnosis: Pain in right elbow  Muscle weakness (generalized)     Problem List Patient Active Problem List   Diagnosis Date Noted  . Right tennis elbow 01/16/2021  . Cervicalgia 01/03/2019  . Migraine without aura and without status migrainosus, not intractable 10/14/2018  . GERD (gastroesophageal reflux disease) 02/14/2012  . Asthma    Amreen Raczkowski C. Ame Heagle PT, DPT 03/05/21 4:29 PM   [facility] [address] [place], [place], [address] Phone: [phone]   Fax:  [phone]  Name: Doreatha Offer MRN: [id number] Date of Birth: 1976/07/16

## 2021-03-10 ENCOUNTER — Ambulatory Visit: Payer: 59 | Admitting: Nurse Practitioner

## 2021-03-10 ENCOUNTER — Ambulatory Visit (HOSPITAL_BASED_OUTPATIENT_CLINIC_OR_DEPARTMENT_OTHER): Payer: 59 | Admitting: Physical Therapy

## 2021-03-13 ENCOUNTER — Encounter: Payer: Self-pay | Admitting: Orthopaedic Surgery

## 2021-03-13 ENCOUNTER — Ambulatory Visit (INDEPENDENT_AMBULATORY_CARE_PROVIDER_SITE_OTHER): Payer: 59 | Admitting: Orthopaedic Surgery

## 2021-03-13 VITALS — Ht 62.0 in | Wt 156.0 lb

## 2021-03-13 DIAGNOSIS — M7711 Lateral epicondylitis, right elbow: Secondary | ICD-10-CM

## 2021-03-13 NOTE — Progress Notes (Signed)
Post-Op Visit Note   Patient: [name] [name]           Date of Birth: 09-Mar-1976           MRN: [id number] Visit Date: 03/13/2021 PCP: Milus Height, PA   Assessment & Plan:  Chief Complaint:  Chief Complaint  Patient presents with  . Right Elbow - Follow-up   Visit Diagnoses:  1. Lateral epicondylitis, right elbow     Plan:   Naveena is approximately 8 weeks status post right tennis elbow release and tendon repair.  Overall she has noticed significant improvement range of motion with daily activities.  Daily activities and much easier in general.  She is currently from home 4 hours a day.  She uses elbow brace and the wrist splint.  She is attending our physical therapy office once a week and making steady progress.  Overall she is happy.  She still endorses some swelling and soreness and discomfort.  Right elbow shows a fully healed surgical scar.  She has moderate swelling.  No pain with passive stretch of the wrist.  Range of motion is normal.  Strength is still moderately decreased.  At this point she may begin to wean the wrist splint and use elbow brace as needed with activities that she feels she needs the brace.  Work note provided for up to 6 hours a day with up to 2 days a week in the office and 3 days at home for 6 weeks after May 11.  I would like to recheck her in 8 weeks.   Follow-Up Instructions: Return in about 8 weeks (around 05/08/2021).   Orders:  No orders of the defined types were placed in this encounter.  No orders of the defined types were placed in this encounter.   Imaging: No results found.  PMFS History: Patient Active Problem List   Diagnosis Date Noted  . Right tennis elbow 01/16/2021  . Cervicalgia 01/03/2019  . Migraine without aura and without status migrainosus, not intractable 10/14/2018  . GERD (gastroesophageal reflux disease) 02/14/2012  . Asthma    Past Medical History:  Diagnosis Date  . Asthma   . GERD (gastroesophageal reflux  disease)   . Migraine   . Scoliosis   . TIA (transient ischemic attack) 01/2012   migraine variant, no clot but vessel spasm    Family History  Problem Relation Age of Onset  . Diabetes Father   . Hypertension Father   . Hyperlipidemia Father   . Cancer Father        prostate  . Heart attack Father        x 2  . Cancer Maternal Uncle        STOMACH  . Diabetes Maternal Uncle   . Cancer Maternal Grandfather        LUNG  . Cancer Paternal Grandmother        stomach  . Cancer Paternal Grandfather        lung    Past Surgical History:  Procedure Laterality Date  . CESAREAN SECTION  1997   boy   Social History   Occupational History    Comment: [employer], [employer]  Tobacco Use  . Smoking status: Former Smoker    Years: 10.00    Quit date: 12/12/2005    Years since quitting: 15.2  . Smokeless tobacco: Never Used  Vaping Use  . Vaping Use: Never used  Substance and Sexual Activity  . Alcohol use: Yes  Alcohol/week: 0.0 standard drinks    Comment: 10/14/18 2-3 x month  . Drug use: No  . Sexual activity: Yes    Partners: Male    Birth control/protection: Rhythm, Condom    Comment: 1st intercourse 45 yo-More than 5 partners

## 2021-03-17 ENCOUNTER — Encounter (HOSPITAL_BASED_OUTPATIENT_CLINIC_OR_DEPARTMENT_OTHER): Payer: Self-pay | Admitting: Physical Therapy

## 2021-03-17 ENCOUNTER — Other Ambulatory Visit: Payer: Self-pay

## 2021-03-17 ENCOUNTER — Ambulatory Visit (HOSPITAL_BASED_OUTPATIENT_CLINIC_OR_DEPARTMENT_OTHER): Payer: 59 | Admitting: Physical Therapy

## 2021-03-17 DIAGNOSIS — M25521 Pain in right elbow: Secondary | ICD-10-CM

## 2021-03-17 DIAGNOSIS — M6281 Muscle weakness (generalized): Secondary | ICD-10-CM

## 2021-03-17 NOTE — Therapy (Signed)
[facility] [address] [place], [place], [address] Phone: [phone]   Fax:  [phone]  Physical Therapy Treatment  Patient Details  Name: [name] [name] MRN: [id number] Date of Birth: 12-Oct-1976 Referring Provider (PT): Jari Sportsman, [place]   Encounter Date: 03/17/2021   PT End of Session - 03/17/21 1101    Visit Number 4    Number of Visits 25    Date for PT Re-Evaluation 05/16/21    Authorization Type UHC    PT Start Time 1100    PT Stop Time 1145    PT Time Calculation (min) 45 min    Activity Tolerance Patient tolerated treatment well    Behavior During Therapy [facility] for tasks assessed/performed           Past Medical History:  Diagnosis Date  . Asthma   . GERD (gastroesophageal reflux disease)   . Migraine   . Scoliosis   . TIA (transient ischemic attack) 01/2012   migraine variant, no clot but vessel spasm    Past Surgical History:  Procedure Laterality Date  . CESAREAN SECTION  1997   boy    There were no vitals filed for this visit.   Subjective Assessment - 03/17/21 1101    Subjective Feels like strength and grip are different. A little numbness and fatigue with activity.    Currently in Pain? No/denies              [facility] PT Assessment - 03/17/21 0001      Strength   Right Hand Grip (lbs) 40 (P)                          [facility] Adult PT Treatment/Exercise - 03/17/21 0001      Shoulder Exercises: Standing   External Rotation Strengthening;20 reps    Theraband Level (Shoulder External Rotation) Level 2 (Red)    External Rotation Limitations 2 reps    Internal Rotation 20 reps    Theraband Level (Shoulder Internal Rotation) Level 2 (Red)    Internal Rotation Limitations 2 sets    Flexion Limitations standing Y- elbows ext 1lb in each hand      Shoulder Exercises: ROM/Strengthening   UBE (Upper Arm Bike) retro 30 min L1.5    Pushups Limitations wall triceps push ups, hands  at eye level      Shoulder Exercises: Stretch   Other Shoulder Stretches wrist flex + ext      Manual Therapy   Soft tissue mobilization IASTM wrist extensors                    PT Short Term Goals - 02/21/21 1641      PT SHORT TERM GOAL #1   Title able to type for at least half day at work <=2/10 pain    Baseline significant and limiting at eval    Time 4    Period Weeks    Status New    Target Date 03/21/21      PT SHORT TERM GOAL #2   Title able to tolerance static, body weight CKC pressure through UEs    Baseline unable at eval    Time 4    Period Weeks    Status New    Target Date 03/21/21      PT SHORT TERM GOAL #3   Title pt will use Rt hand for light ADLs such as washing dishes and carrying small  objects <=5lb comfortably    Baseline unable at eval    Time 4    Period Weeks    Status New    Target Date 03/21/21             PT Long Term Goals - 02/21/21 1643      PT LONG TERM GOAL #1   Title grip strength equal Rt to Lt    Baseline see flowsheet    Time 12    Period Weeks    Status New    Target Date 05/16/21      PT LONG TERM GOAL #2   Title pt will be able to hold her dog with use of Rt hand    Baseline unable at eval    Time 12    Period Weeks    Status New    Target Date 05/16/21      PT LONG TERM GOAL #3   Title pt will return to boxing with understanding of limitations & respect to keep pain <=2/10    Baseline unable at eval    Time 12    Period Weeks    Status New    Target Date 05/16/21      PT LONG TERM GOAL #4   Title pt will return to yoga and running without limitations by elbow pain    Baseline unable at eval    Time 12    Period Weeks    Status New    Target Date 05/16/21                 Plan - 03/17/21 1900    Clinical Impression Statement Strength incr to 40lb today (20 at eval) so began band resisted and CKC exercises. Followed by IASTM with biofreeze for decreased soreness.    PT  Treatment/Interventions ADLs/Self Care Home Management;Cryotherapy;Electrical Stimulation;Functional mobility training;Moist Heat;Therapeutic activities;Therapeutic exercise;Neuromuscular re-education;Passive range of motion;Scar mobilization;Patient/family education;Manual techniques;Dry needling;Taping;Vasopneumatic Device    PT Next Visit Plan cont resisted and RC exercises    PT Home Exercise Plan [id number], ice frequently through day    Consulted and Agree with Plan of Care Patient           Patient will benefit from skilled therapeutic intervention in order to improve the following deficits and impairments:  Impaired UE functional use,Increased muscle spasms,Decreased activity tolerance,Pain,Impaired flexibility,Decreased strength  Visit Diagnosis: Pain in right elbow  Muscle weakness (generalized)     Problem List Patient Active Problem List   Diagnosis Date Noted  . Right tennis elbow 01/16/2021  . Cervicalgia 01/03/2019  . Migraine without aura and without status migrainosus, not intractable 10/14/2018  . GERD (gastroesophageal reflux disease) 02/14/2012  . Asthma   Pete Schnitzer C. Kona Yusuf PT, DPT 03/17/21 7:03 PM   [facility] [address] [place], [place], [address] Phone: [phone]   Fax:  [phone]  Name: [name] [name] MRN: [id number] Date of Birth: 10-22-1976

## 2021-03-24 ENCOUNTER — Encounter: Payer: Self-pay | Admitting: Nurse Practitioner

## 2021-03-24 ENCOUNTER — Ambulatory Visit (INDEPENDENT_AMBULATORY_CARE_PROVIDER_SITE_OTHER): Payer: 59 | Admitting: Nurse Practitioner

## 2021-03-24 ENCOUNTER — Other Ambulatory Visit: Payer: Self-pay

## 2021-03-24 VITALS — BP 118/76 | Ht 62.0 in | Wt 157.0 lb

## 2021-03-24 DIAGNOSIS — Z01419 Encounter for gynecological examination (general) (routine) without abnormal findings: Secondary | ICD-10-CM

## 2021-03-24 DIAGNOSIS — E785 Hyperlipidemia, unspecified: Secondary | ICD-10-CM | POA: Diagnosis not present

## 2021-03-24 DIAGNOSIS — B373 Candidiasis of vulva and vagina: Secondary | ICD-10-CM

## 2021-03-24 DIAGNOSIS — B3731 Acute candidiasis of vulva and vagina: Secondary | ICD-10-CM

## 2021-03-24 MED ORDER — TERCONAZOLE 0.4 % VA CREA: 1.0000 | TOPICAL_CREAM | Freq: Every day | VAGINAL | 0 refills | Status: DC

## 2021-03-24 NOTE — Progress Notes (Signed)
   [name] [name] [birth year] [id number]   History:  45 y.o. G2P1011 presents for annual exam. Normal pap and mammogram history. History of TIA. Complains of vulvar itching today. On ampicillin for acne management and feels she gets yeast infections often. Surgery on elbow 12/2020, recovering well.   Gynecologic History Patient's last menstrual period was 03/02/2021. Period Cycle (Days):  (Every 30-40 days) Period Duration (Days): 6 Period Pattern: (!) Irregular Menstrual Flow: Moderate,Heavy Dysmenorrhea: (!) Mild Dysmenorrhea Symptoms: Cramping Contraception/Family planning: condoms and rhythm method  Health Maintenance Last Pap: 12/30/2018. Results were: normal Last mammogram: 2019. Results were: normal Last colonoscopy: N/A Last Dexa: N/A  Past medical history, past surgical history, family history and social history were all reviewed and documented in the EPIC chart. Separated. Works in [employer] scheme manager.   ROS:  A ROS was performed and pertinent positives and negatives are included.  Exam:  Vitals:   03/24/21 1432  BP: 118/76  Weight: 157 lb (71.2 kg)  Height: 5\' 2"  (1.575 m)   Body mass index is 28.72 kg/m.  General appearance:  Normal Thyroid:  Symmetrical, normal in size, without palpable masses or nodularity. Respiratory  Auscultation:  Clear without wheezing or rhonchi Cardiovascular  Auscultation:  Regular rate, without rubs, murmurs or gallops  Edema/varicosities:  Not grossly evident Abdominal  Soft,nontender, without masses, guarding or rebound.  Liver/spleen:  No organomegaly noted  Hernia:  None appreciated  Skin  Inspection:  Grossly normal Breasts: Examined lying and sitting.   Right: Without masses, retractions, nipple discharge or axillary adenopathy.   Left: Without masses, retractions, nipple discharge or axillary adenopathy. Gentitourinary   Inguinal/mons:  Normal without inguinal adenopathy  External genitalia:  Generalized redness with no masses,  tenderness, or lesions  BUS/Urethra/Skene's glands:  Normal  Vagina:  Normal appearing with normal color and discharge, no lesions  Cervix:  Normal appearing without discharge or lesions  Uterus:  Normal in size, shape and contour.  Midline and mobile, nontender  Adnexa/parametria:     Rt: Normal in size, without masses or tenderness.   Lt: Normal in size, without masses or tenderness.  Anus and perineum: Normal  Assessment/Plan:  45 y.o. G2P1011 for annual exam.   Well female exam with routine gynecological exam - Plan: CBC with Differential/Platelet, Comprehensive metabolic panel. She will return fasting for labs. Education provided on SBEs, importance of preventative screenings, current guidelines, high calcium diet, regular exercise, and multivitamin daily.  Vulvar candidiasis - Plan: terconazole (TERAZOL 7) 0.4 % vaginal cream as needed for vaginal itching. She is on Ampicillin for ance management and reports vaginal itching. Treats with OTC medication or Diflucan.   Hyperlipidemia, unspecified hyperlipidemia type - Plan: Lipid panel. PCP manages hyperlipidemia. She requested to have labs done here and will send report to PCP.   Screening for cervical cancer - Normal Pap history.  Will repeat at 5-year interval per guidelines.  Screening for breast cancer - Normal mammogram history. Overdue for mammogram and plans to schedule this soon. Normal breast exam today.  Return in 1 year for annual    55 DNP, 3:04 PM 03/24/2021

## 2021-03-24 NOTE — Patient Instructions (Addendum)
Schedule mammogram!  Health Maintenance, Female Adopting a healthy lifestyle and getting preventive care are important in promoting health and wellness. Ask your health care provider about:  The right schedule for you to have regular tests and exams.  Things you can do on your own to prevent diseases and keep yourself healthy. What should I know about diet, weight, and exercise? Eat a healthy diet  Eat a diet that includes plenty of vegetables, fruits, low-fat dairy products, and lean protein.  Do not eat a lot of foods that are high in solid fats, added sugars, or sodium.   Maintain a healthy weight Body mass index (BMI) is used to identify weight problems. It estimates body fat based on height and weight. Your health care provider can help determine your BMI and help you achieve or maintain a healthy weight. Get regular exercise Get regular exercise. This is one of the most important things you can do for your health. Most adults should:  Exercise for at least 150 minutes each week. The exercise should increase your heart rate and make you sweat (moderate-intensity exercise).  Do strengthening exercises at least twice a week. This is in addition to the moderate-intensity exercise.  Spend less time sitting. Even light physical activity can be beneficial. Watch cholesterol and blood lipids Have your blood tested for lipids and cholesterol at 44 years of age, then have this test every 5 years. Have your cholesterol levels checked more often if:  Your lipid or cholesterol levels are high.  You are older than 44 years of age.  You are at high risk for heart disease. What should I know about cancer screening? Depending on your health history and family history, you may need to have cancer screening at various ages. This may include screening for:  Breast cancer.  Cervical cancer.  Colorectal cancer.  Skin cancer.  Lung cancer. What should I know about heart disease, diabetes,  and high blood pressure? Blood pressure and heart disease  High blood pressure causes heart disease and increases the risk of stroke. This is more likely to develop in people who have high blood pressure readings, are of African descent, or are overweight.  Have your blood pressure checked: ? Every 3-5 years if you are 69-64 years of age. ? Every year if you are 81 years old or older. Diabetes Have regular diabetes screenings. This checks your fasting blood sugar level. Have the screening done:  Once every three years after age 60 if you are at a normal weight and have a low risk for diabetes.  More often and at a younger age if you are overweight or have a high risk for diabetes. What should I know about preventing infection? Hepatitis B If you have a higher risk for hepatitis B, you should be screened for this virus. Talk with your health care provider to find out if you are at risk for hepatitis B infection. Hepatitis C Testing is recommended for:  Everyone born from [birth year] through 1964.  Anyone with known risk factors for hepatitis C. Sexually transmitted infections (STIs)  Get screened for STIs, including gonorrhea and chlamydia, if: ? You are sexually active and are younger than 44 years of age. ? You are older than 44 years of age and your health care provider tells you that you are at risk for this type of infection. ? Your sexual activity has changed since you were last screened, and you are at increased risk for chlamydia or gonorrhea. Ask your  health care provider if you are at risk.  Ask your health care provider about whether you are at high risk for HIV. Your health care provider may recommend a prescription medicine to help prevent HIV infection. If you choose to take medicine to prevent HIV, you should first get tested for HIV. You should then be tested every 3 months for as long as you are taking the medicine. Pregnancy  If you are about to stop having your period  (premenopausal) and you may become pregnant, seek counseling before you get pregnant.  Take 400 to 800 micrograms (mcg) of folic acid every day if you become pregnant.  Ask for birth control (contraception) if you want to prevent pregnancy. Osteoporosis and menopause Osteoporosis is a disease in which the bones lose minerals and strength with aging. This can result in bone fractures. If you are 24 years old or older, or if you are at risk for osteoporosis and fractures, ask your health care provider if you should:  Be screened for bone loss.  Take a calcium or vitamin D supplement to lower your risk of fractures.  Be given hormone replacement therapy (HRT) to treat symptoms of menopause. Follow these instructions at home: Lifestyle  Do not use any products that contain nicotine or tobacco, such as cigarettes, e-cigarettes, and chewing tobacco. If you need help quitting, ask your health care provider.  Do not use street drugs.  Do not share needles.  Ask your health care provider for help if you need support or information about quitting drugs. Alcohol use  Do not drink alcohol if: ? Your health care provider tells you not to drink. ? You are pregnant, may be pregnant, or are planning to become pregnant.  If you drink alcohol: ? Limit how much you use to 0-1 drink a day. ? Limit intake if you are breastfeeding.  Be aware of how much alcohol is in your drink. In the [place] one drink equals one 12 oz bottle of beer (355 mL), one 5 oz glass of wine (148 mL), or one 1 oz glass of hard liquor (44 mL). General instructions  Schedule regular health, dental, and eye exams.  Stay current with your vaccines.  Tell your health care provider if: ? You often feel depressed. ? You have ever been abused or do not feel safe at home. Summary  Adopting a healthy lifestyle and getting preventive care are important in promoting health and wellness.  Follow your health care provider's  instructions about healthy diet, exercising, and getting tested or screened for diseases.  Follow your health care provider's instructions on monitoring your cholesterol and blood pressure. This information is not intended to replace advice given to you by your health care provider. Make sure you discuss any questions you have with your health care provider. Document Revised: 11/02/2018 Document Reviewed: 11/02/2018 Elsevier Patient Education  2021 [employer].

## 2021-03-26 ENCOUNTER — Ambulatory Visit (HOSPITAL_BASED_OUTPATIENT_CLINIC_OR_DEPARTMENT_OTHER): Payer: 59 | Attending: Physician Assistant | Admitting: Physical Therapy

## 2021-03-26 ENCOUNTER — Encounter (HOSPITAL_BASED_OUTPATIENT_CLINIC_OR_DEPARTMENT_OTHER): Payer: Self-pay | Admitting: Physical Therapy

## 2021-03-26 ENCOUNTER — Other Ambulatory Visit: Payer: Self-pay

## 2021-03-26 DIAGNOSIS — M25521 Pain in right elbow: Secondary | ICD-10-CM | POA: Insufficient documentation

## 2021-03-26 DIAGNOSIS — M6281 Muscle weakness (generalized): Secondary | ICD-10-CM | POA: Diagnosis present

## 2021-03-26 NOTE — Therapy (Signed)
[facility] [address] [place], [place], [address] Phone: [phone]   Fax:  [phone]  Physical Therapy Treatment  Patient Details  Name: [name] [name] MRN: [id number] Date of Birth: 10-12-1976 Referring Provider (PT): Dwana Melena, [place]   Encounter Date: 03/26/2021   PT End of Session - 03/26/21 1450    Visit Number 5    Number of Visits 25    Date for PT Re-Evaluation 05/16/21    Authorization Type UHC    PT Start Time 5102    PT Stop Time 1515    PT Time Calculation (min) 43 min    Activity Tolerance Patient tolerated treatment well    Behavior During Therapy [facility] for tasks assessed/performed           Past Medical History:  Diagnosis Date  . Asthma   . GERD (gastroesophageal reflux disease)   . Migraine   . Scoliosis   . TIA (transient ischemic attack) 01/2012   migraine variant, no clot but vessel spasm    Past Surgical History:  Procedure Laterality Date  . CESAREAN SECTION  1997   boy  . ELBOW SURGERY      There were no vitals filed for this visit.   Subjective Assessment - 03/26/21 1450    Subjective Feels really tight    Patient Stated Goals yoga, run, workout, boxing, walk dog    Currently in Pain? No/denies                             [facility] Adult PT Treatment/Exercise - 03/26/21 0001      Shoulder Exercises: Seated   External Rotation Limitations GHJ at 90 abd, elbow on ball, 1lb in hand- in pronation & neutral    Other Seated Exercises ball press down in pronation & supination      Shoulder Exercises: Standing   Other Standing Exercises farmers carry; wall push ups- tricep, wide & plyo    Other Standing Exercises mini circles at 90 abd      Wrist Exercises   Other wrist exercises pronation/supination 1lb hammer hold      Manual Therapy   Soft tissue mobilization IASTM lat epicondyle and extensor group                    PT Short Term Goals - 03/26/21  1456      PT SHORT TERM GOAL #1   Title able to type for at least half day at work <=2/10 pain    Baseline tighness and discomfort with slight pain    Status Partially Met      PT SHORT TERM GOAL #2   Title able to tolerance static, body weight CKC pressure through UEs    Status Achieved      PT SHORT TERM GOAL #3   Title pt will use Rt hand for light ADLs such as washing dishes and carrying small objects <=5lb comfortably    Baseline using Rt arm for ADLs. able to carry 5lb but would not call it comfortable.    Status Partially Met             PT Long Term Goals - 02/21/21 1643      PT LONG TERM GOAL #1   Title grip strength equal Rt to Lt    Baseline see flowsheet    Time 12    Period Weeks    Status New  Target Date 05/16/21      PT LONG TERM GOAL #2   Title pt will be able to hold her dog with use of Rt hand    Baseline unable at eval    Time 12    Period Weeks    Status New    Target Date 05/16/21      PT LONG TERM GOAL #3   Title pt will return to boxing with understanding of limitations & respect to keep pain <=2/10    Baseline unable at eval    Time 12    Period Weeks    Status New    Target Date 05/16/21      PT LONG TERM GOAL #4   Title pt will return to yoga and running without limitations by elbow pain    Baseline unable at eval    Time 12    Period Weeks    Status New    Target Date 05/16/21                 Plan - 03/26/21 1908    Clinical Impression Statement continued to progress strength demands on around elbow. unable to comfortably carry the 5lb weight for walk around building but reported discomfort rather than pain. healing well and using brace less often.    PT Treatment/Interventions ADLs/Self Care Home Management;Cryotherapy;Electrical Stimulation;Functional mobility training;Moist Heat;Therapeutic activities;Therapeutic exercise;Neuromuscular re-education;Passive range of motion;Scar mobilization;Patient/family  education;Manual techniques;Dry needling;Taping;Vasopneumatic Device    PT Home Exercise Plan [id number], ice frequently through day    Consulted and Agree with Plan of Care Patient           Patient will benefit from skilled therapeutic intervention in order to improve the following deficits and impairments:  Impaired UE functional use,Increased muscle spasms,Decreased activity tolerance,Pain,Impaired flexibility,Decreased strength  Visit Diagnosis: Pain in right elbow  Muscle weakness (generalized)     Problem List Patient Active Problem List   Diagnosis Date Noted  . Right tennis elbow 01/16/2021  . Cervicalgia 01/03/2019  . Migraine without aura and without status migrainosus, not intractable 10/14/2018  . GERD (gastroesophageal reflux disease) 02/14/2012  . Asthma     Xiong Haidar C. Leonor Darnell PT, DPT 03/26/21 7:10 PM   [facility] [address] [place], [place], [address] Phone: [phone]   Fax:  [phone]  Name: Kayci [place]ville MRN: [id number] Date of Birth: [birth year]

## 2021-03-28 ENCOUNTER — Other Ambulatory Visit: Payer: Self-pay

## 2021-03-28 ENCOUNTER — Encounter (HOSPITAL_BASED_OUTPATIENT_CLINIC_OR_DEPARTMENT_OTHER): Payer: Self-pay | Admitting: Physical Therapy

## 2021-03-28 ENCOUNTER — Ambulatory Visit (HOSPITAL_BASED_OUTPATIENT_CLINIC_OR_DEPARTMENT_OTHER): Payer: 59 | Admitting: Physical Therapy

## 2021-03-28 DIAGNOSIS — M25521 Pain in right elbow: Secondary | ICD-10-CM | POA: Diagnosis not present

## 2021-03-28 DIAGNOSIS — M6281 Muscle weakness (generalized): Secondary | ICD-10-CM

## 2021-03-28 NOTE — Therapy (Signed)
[facility] [address] [place], [place], [address] Phone: [phone]   Fax:  [phone]  Physical Therapy Treatment  Patient Details  Name: [name] [name] MRN: [id number] Date of Birth: [birth year] Referring Provider (PT): Dwana Melena, [place]   Encounter Date: 03/28/2021   PT End of Session - 03/28/21 1348    Visit Number 6    Number of Visits 25    Date for PT Re-Evaluation 05/16/21    Authorization Type UHC    PT Start Time 3846    PT Stop Time 1429    PT Time Calculation (min) 43 min    Activity Tolerance Patient tolerated treatment well    Behavior During Therapy [facility] for tasks assessed/performed           Past Medical History:  Diagnosis Date  . Asthma   . GERD (gastroesophageal reflux disease)   . Migraine   . Scoliosis   . TIA (transient ischemic attack) 01/2012   migraine variant, no clot but vessel spasm    Past Surgical History:  Procedure Laterality Date  . CESAREAN SECTION  1997   boy  . ELBOW SURGERY      There were no vitals filed for this visit.   Subjective Assessment - 03/28/21 1349    Subjective feels tight. walked with 5lb and cooked yesterday.    Currently in Pain? No/denies                             [facility] Adult PT Treatment/Exercise - 03/28/21 0001      Shoulder Exercises: Supine   Other Supine Exercises ABCs 2lb      Shoulder Exercises: Prone   Other Prone Exercises qped rows 2lb      Shoulder Exercises: Standing   Other Standing Exercises wall push ups-Rt hand on ball      Shoulder Exercises: ROM/Strengthening   UBE (Upper Arm Bike) retro 3 min- tactile cues to avoid shoulder hike    Other ROM/Strengthening Exercises IYT to 90 2lb- against wall      Wrist Exercises   Wrist Extension Strengthening;Right;20 reps;Seated    Bar Weights/Barbell (Wrist Extension) 2 lbs    Wrist Extension Limitations elbow bent    Other wrist exercises pronation/supination  2lb, elbows on bolster      Modalities   Modalities Cryotherapy      Cryotherapy   Number Minutes Cryotherapy 10 Minutes   5 min with edu   Cryotherapy Location --   elbow   Type of Cryotherapy Ice pack      Manual Therapy   Soft tissue mobilization IASTM lat epicondyle and extensor group                    PT Short Term Goals - 03/26/21 1456      PT SHORT TERM GOAL #1   Title able to type for at least half day at work <=2/10 pain    Baseline tighness and discomfort with slight pain    Status Partially Met      PT SHORT TERM GOAL #2   Title able to tolerance static, body weight CKC pressure through UEs    Status Achieved      PT SHORT TERM GOAL #3   Title pt will use Rt hand for light ADLs such as washing dishes and carrying small objects <=5lb comfortably    Baseline using Rt arm for ADLs. able to  carry 5lb but would not call it comfortable.    Status Partially Met             PT Long Term Goals - 02/21/21 1643      PT LONG TERM GOAL #1   Title grip strength equal Rt to Lt    Baseline see flowsheet    Time 12    Period Weeks    Status New    Target Date 05/16/21      PT LONG TERM GOAL #2   Title pt will be able to hold her dog with use of Rt hand    Baseline unable at eval    Time 12    Period Weeks    Status New    Target Date 05/16/21      PT LONG TERM GOAL #3   Title pt will return to boxing with understanding of limitations & respect to keep pain <=2/10    Baseline unable at eval    Time 12    Period Weeks    Status New    Target Date 05/16/21      PT LONG TERM GOAL #4   Title pt will return to yoga and running without limitations by elbow pain    Baseline unable at eval    Time 12    Period Weeks    Status New    Target Date 05/16/21                 Plan - 03/28/21 1523    Clinical Impression Statement progressed strength challenges and control of shoulders and elbow. cues required to avoid shoulder hike. fatigue without  pain.    PT Treatment/Interventions ADLs/Self Care Home Management;Cryotherapy;Electrical Stimulation;Functional mobility training;Moist Heat;Therapeutic activities;Therapeutic exercise;Neuromuscular re-education;Passive range of motion;Scar mobilization;Patient/family education;Manual techniques;Dry needling;Taping;Vasopneumatic Device    PT Next Visit Plan cont resisted and RC exercises    PT Home Exercise Plan [id number], ice frequently through day    Consulted and Agree with Plan of Care Patient           Patient will benefit from skilled therapeutic intervention in order to improve the following deficits and impairments:  Impaired UE functional use,Increased muscle spasms,Decreased activity tolerance,Pain,Impaired flexibility,Decreased strength  Visit Diagnosis: Pain in right elbow  Muscle weakness (generalized)     Problem List Patient Active Problem List   Diagnosis Date Noted  . Right tennis elbow 01/16/2021  . Cervicalgia 01/03/2019  . Migraine without aura and without status migrainosus, not intractable 10/14/2018  . GERD (gastroesophageal reflux disease) 02/14/2012  . Asthma    [name] [name] C. Eustace Hur PT, DPT 03/28/21 3:25 PM   [facility] [address] [place], [place], [address] Phone: [phone]   Fax:  [phone]  Name: [name] [name] MRN: [id number] Date of Birth: 19-Jan-1976

## 2021-04-02 ENCOUNTER — Encounter (HOSPITAL_BASED_OUTPATIENT_CLINIC_OR_DEPARTMENT_OTHER): Payer: 59 | Admitting: Physical Therapy

## 2021-04-04 ENCOUNTER — Encounter (HOSPITAL_BASED_OUTPATIENT_CLINIC_OR_DEPARTMENT_OTHER): Payer: Self-pay | Admitting: Physical Therapy

## 2021-04-04 ENCOUNTER — Other Ambulatory Visit: Payer: Self-pay

## 2021-04-04 ENCOUNTER — Ambulatory Visit (HOSPITAL_BASED_OUTPATIENT_CLINIC_OR_DEPARTMENT_OTHER): Payer: 59 | Admitting: Physical Therapy

## 2021-04-04 DIAGNOSIS — M25521 Pain in right elbow: Secondary | ICD-10-CM | POA: Diagnosis not present

## 2021-04-04 DIAGNOSIS — M6281 Muscle weakness (generalized): Secondary | ICD-10-CM

## 2021-04-04 NOTE — Therapy (Signed)
[facility] [address] [place], [place], [address] Phone: [phone]   Fax:  [phone]  Physical Therapy Treatment  Patient Details  Name: [name] [name] MRN: [id number] Date of Birth: [birth year] Referring Provider (PT): Dwana Melena, [place]   Encounter Date: 04/04/2021   PT End of Session - 04/04/21 1351    Visit Number 7    Number of Visits 25    Date for PT Re-Evaluation 05/16/21    Authorization Type UHC    PT Start Time 7948    PT Stop Time 1426    PT Time Calculation (min) 38 min    Activity Tolerance Patient tolerated treatment well    Behavior During Therapy [facility] for tasks assessed/performed           Past Medical History:  Diagnosis Date  . Asthma   . GERD (gastroesophageal reflux disease)   . Migraine   . Scoliosis   . TIA (transient ischemic attack) 01/2012   migraine variant, no clot but vessel spasm    Past Surgical History:  Procedure Laterality Date  . CESAREAN SECTION  1997   boy  . ELBOW SURGERY      There were no vitals filed for this visit.   Subjective Assessment - 04/04/21 1349    Subjective HAve not even been wearing braces to work. try to take it easy but lifting 5lb weights.    Patient Stated Goals yoga, run, workout, boxing, walk dog    Currently in Pain? Yes    Pain Score 2     Pain Location Elbow    Pain Orientation Right    Pain Descriptors / Indicators Sore    Aggravating Factors  feels it more when typing, has not tried walking big dog    Pain Relieving Factors rest                             [facility] Adult PT Treatment/Exercise - 04/04/21 0001      Elbow Exercises   Elbow Extension Limitations triceps ext, neutral & pronated    Forearm Supination Limitations red tband resisted supination      Shoulder Exercises: Seated   Protraction Limitations seated punch resisted red tband    Other Seated Exercises overhead press 5lb      Shoulder Exercises:  Standing   Other Standing Exercises abd to 90 with small circles      Manual Therapy   Soft tissue mobilization IASTM lat epicondyle- incision site and into wrist extensor muscle bellies    Passive ROM STM & stretching to wrist extensor group                    PT Short Term Goals - 03/26/21 1456      PT SHORT TERM GOAL #1   Title able to type for at least half day at work <=2/10 pain    Baseline tighness and discomfort with slight pain    Status Partially Met      PT SHORT TERM GOAL #2   Title able to tolerance static, body weight CKC pressure through UEs    Status Achieved      PT SHORT TERM GOAL #3   Title pt will use Rt hand for light ADLs such as washing dishes and carrying small objects <=5lb comfortably    Baseline using Rt arm for ADLs. able to carry 5lb but would not call it comfortable.  Status Partially Met             PT Long Term Goals - 02/21/21 1643      PT LONG TERM GOAL #1   Title grip strength equal Rt to Lt    Baseline see flowsheet    Time 12    Period Weeks    Status New    Target Date 05/16/21      PT LONG TERM GOAL #2   Title pt will be able to hold her dog with use of Rt hand    Baseline unable at eval    Time 12    Period Weeks    Status New    Target Date 05/16/21      PT LONG TERM GOAL #3   Title pt will return to boxing with understanding of limitations & respect to keep pain <=2/10    Baseline unable at eval    Time 12    Period Weeks    Status New    Target Date 05/16/21      PT LONG TERM GOAL #4   Title pt will return to yoga and running without limitations by elbow pain    Baseline unable at eval    Time 12    Period Weeks    Status New    Target Date 05/16/21                 Plan - 04/04/21 1427    Clinical Impression Statement Continued to progress strength to incoorporate full UE for proximal support to distal structures. tightness noted in wrist extensor group and buildup of scar tissue at  incision site addressed with IASTM today. frequent cues required to avoid GHJ elevation.    PT Treatment/Interventions ADLs/Self Care Home Management;Cryotherapy;Electrical Stimulation;Functional mobility training;Moist Heat;Therapeutic activities;Therapeutic exercise;Neuromuscular re-education;Passive range of motion;Scar mobilization;Patient/family education;Manual techniques;Dry needling;Taping;Vasopneumatic Device    PT Next Visit Plan throwing    PT [place], ice frequently through day    Consulted and Agree with Plan of Care Patient           Patient will benefit from skilled therapeutic intervention in order to improve the following deficits and impairments:  Impaired UE functional use,Increased muscle spasms,Decreased activity tolerance,Pain,Impaired flexibility,Decreased strength  Visit Diagnosis: Pain in right elbow  Muscle weakness (generalized)     Problem List Patient Active Problem List   Diagnosis Date Noted  . Right tennis elbow 01/16/2021  . Cervicalgia 01/03/2019  . Migraine without aura and without status migrainosus, not intractable 10/14/2018  . GERD (gastroesophageal reflux disease) 02/14/2012  . Asthma     Vester Titsworth C. Daundre Biel PT, DPT 04/04/21 2:29 PM   [facility] [address] [place], [place], [address] Phone: [phone]   Fax:  [phone]  Name: [name] [name] MRN: [id number] Date of Birth: 1976/06/14

## 2021-04-09 ENCOUNTER — Ambulatory Visit (HOSPITAL_BASED_OUTPATIENT_CLINIC_OR_DEPARTMENT_OTHER): Payer: 59 | Admitting: Physical Therapy

## 2021-04-09 ENCOUNTER — Other Ambulatory Visit: Payer: Self-pay

## 2021-04-09 ENCOUNTER — Encounter (HOSPITAL_BASED_OUTPATIENT_CLINIC_OR_DEPARTMENT_OTHER): Payer: Self-pay | Admitting: Physical Therapy

## 2021-04-09 DIAGNOSIS — M25521 Pain in right elbow: Secondary | ICD-10-CM | POA: Diagnosis not present

## 2021-04-09 DIAGNOSIS — M6281 Muscle weakness (generalized): Secondary | ICD-10-CM

## 2021-04-09 NOTE — Therapy (Signed)
[facility] [address] [place], [place], [address] Phone: [phone]   Fax:  [phone]  Physical Therapy Treatment  Patient Details  Name: [name] [name] MRN: [id number] Date of Birth: 1976/07/21 Referring Provider (PT): Dwana Melena, [place]   Encounter Date: 04/09/2021   PT End of Session - 04/09/21 1422    Visit Number 8    Number of Visits 25    Date for PT Re-Evaluation 05/16/21    Authorization Type UHC    PT Start Time 1350    PT Stop Time 1428    PT Time Calculation (min) 38 min    Activity Tolerance Patient tolerated treatment well    Behavior During Therapy [facility] for tasks assessed/performed           Past Medical History:  Diagnosis Date  . Asthma   . GERD (gastroesophageal reflux disease)   . Migraine   . Scoliosis   . TIA (transient ischemic attack) 01/2012   migraine variant, no clot but vessel spasm    Past Surgical History:  Procedure Laterality Date  . CESAREAN SECTION  1997   boy  . ELBOW SURGERY      There were no vitals filed for this visit.   Subjective Assessment - 04/09/21 1420    Subjective It is really sore. First day back to the office in 2 yrs. tried walking smaller dog but he pulled a lot.    Patient Stated Goals yoga, run, workout, boxing, walk dog    Currently in Pain? Yes    Pain Score 5     Pain Location Elbow    Pain Orientation Right;Lateral    Pain Descriptors / Indicators Sore                             [facility] Adult PT Treatment/Exercise - 04/09/21 0001      Cryotherapy   Number Minutes Cryotherapy 10 Minutes    Cryotherapy Location --   elbow   Type of Cryotherapy Ice pack      Manual Therapy   Soft tissue mobilization wrist extensor grp and edema mobs. IASTM lat epicondyle & wrist extensors                    PT Short Term Goals - 03/26/21 1456      PT SHORT TERM GOAL #1   Title able to type for at least half day at work <=2/10  pain    Baseline tighness and discomfort with slight pain    Status Partially Met      PT SHORT TERM GOAL #2   Title able to tolerance static, body weight CKC pressure through UEs    Status Achieved      PT SHORT TERM GOAL #3   Title pt will use Rt hand for light ADLs such as washing dishes and carrying small objects <=5lb comfortably    Baseline using Rt arm for ADLs. able to carry 5lb but would not call it comfortable.    Status Partially Met             PT Long Term Goals - 02/21/21 1643      PT LONG TERM GOAL #1   Title grip strength equal Rt to Lt    Baseline see flowsheet    Time 12    Period Weeks    Status New    Target Date 05/16/21  PT LONG TERM GOAL #2   Title pt will be able to hold her dog with use of Rt hand    Baseline unable at eval    Time 12    Period Weeks    Status New    Target Date 05/16/21      PT LONG TERM GOAL #3   Title pt will return to boxing with understanding of limitations & respect to keep pain <=2/10    Baseline unable at eval    Time 12    Period Weeks    Status New    Target Date 05/16/21      PT LONG TERM GOAL #4   Title pt will return to yoga and running without limitations by elbow pain    Baseline unable at eval    Time 12    Period Weeks    Status New    Target Date 05/16/21                 Plan - 04/09/21 1431    Clinical Impression Statement Cont to incr use of arm and experienced some incr soreness as expected. Notable nodules of scar tissue and incr fascia buildup along muscles addressed with IASTM. Ice placed following and asked her to return to wearing brace while at work. She is almost 12 weeks out and will be able to progress challenges.    PT Treatment/Interventions ADLs/Self Care Home Management;Cryotherapy;Electrical Stimulation;Functional mobility training;Moist Heat;Therapeutic activities;Therapeutic exercise;Neuromuscular re-education;Passive range of motion;Scar mobilization;Patient/family  education;Manual techniques;Dry needling;Taping;Vasopneumatic Device    PT Next Visit Plan throwing    PT [place], ice frequently through day    Consulted and Agree with Plan of Care Patient           Patient will benefit from skilled therapeutic intervention in order to improve the following deficits and impairments:  Impaired UE functional use,Increased muscle spasms,Decreased activity tolerance,Pain,Impaired flexibility,Decreased strength  Visit Diagnosis: Pain in right elbow  Muscle weakness (generalized)     Problem List Patient Active Problem List   Diagnosis Date Noted  . Right tennis elbow 01/16/2021  . Cervicalgia 01/03/2019  . Migraine without aura and without status migrainosus, not intractable 10/14/2018  . GERD (gastroesophageal reflux disease) 02/14/2012  . Asthma     Mildred Tuccillo C. Reily Ilic PT, DPT 04/09/21 2:35 PM   [facility] [address] [place], [place], [address] Phone: [phone]   Fax:  [phone]  Name: Dorothey Oetken MRN: [id number] Date of Birth: [birth year]

## 2021-04-11 ENCOUNTER — Other Ambulatory Visit: Payer: Self-pay

## 2021-04-11 ENCOUNTER — Ambulatory Visit (HOSPITAL_BASED_OUTPATIENT_CLINIC_OR_DEPARTMENT_OTHER): Payer: 59 | Admitting: Physical Therapy

## 2021-04-11 ENCOUNTER — Encounter (HOSPITAL_BASED_OUTPATIENT_CLINIC_OR_DEPARTMENT_OTHER): Payer: Self-pay | Admitting: Physical Therapy

## 2021-04-11 DIAGNOSIS — M25521 Pain in right elbow: Secondary | ICD-10-CM

## 2021-04-11 DIAGNOSIS — M6281 Muscle weakness (generalized): Secondary | ICD-10-CM

## 2021-04-11 NOTE — Therapy (Signed)
[facility] [address] [place], [place], [address] Phone: [phone]   Fax:  [phone]  Physical Therapy Treatment  Patient Details  Name: [name] [name] MRN: [id number] Date of Birth: 1976/05/06 Referring Provider (PT): Dwana Melena, [place]   Encounter Date: 04/11/2021   PT End of Session - 04/11/21 1354    Visit Number 9    Number of Visits 25    Date for PT Re-Evaluation 05/16/21    Authorization Type UHC    PT Start Time 2542    PT Stop Time 1430    PT Time Calculation (min) 38 min    Activity Tolerance Patient tolerated treatment well    Behavior During Therapy [facility] for tasks assessed/performed           Past Medical History:  Diagnosis Date  . Asthma   . GERD (gastroesophageal reflux disease)   . Migraine   . Scoliosis   . TIA (transient ischemic attack) 01/2012   migraine variant, no clot but vessel spasm    Past Surgical History:  Procedure Laterality Date  . CESAREAN SECTION  1997   boy  . ELBOW SURGERY      There were no vitals filed for this visit.                      [facility] Adult PT Treatment/Exercise - 04/11/21 0001      Vasopneumatic   Number Minutes Vasopneumatic  15 minutes    Vasopnuematic Location  --   elbow   Vasopneumatic Pressure Medium    Vasopneumatic Temperature  34      Manual Therapy   Manual Therapy Taping    Edema Management lateral epicondyle    Soft tissue mobilization IASTM wrist extensors    Kinesiotex Edema      Kinesiotix   Edema Rt lat epicondyle                    PT Short Term Goals - 03/26/21 1456      PT SHORT TERM GOAL #1   Title able to type for at least half day at work <=2/10 pain    Baseline tighness and discomfort with slight pain    Status Partially Met      PT SHORT TERM GOAL #2   Title able to tolerance static, body weight CKC pressure through UEs    Status Achieved      PT SHORT TERM GOAL #3   Title pt will use  Rt hand for light ADLs such as washing dishes and carrying small objects <=5lb comfortably    Baseline using Rt arm for ADLs. able to carry 5lb but would not call it comfortable.    Status Partially Met             PT Long Term Goals - 02/21/21 1643      PT LONG TERM GOAL #1   Title grip strength equal Rt to Lt    Baseline see flowsheet    Time 12    Period Weeks    Status New    Target Date 05/16/21      PT LONG TERM GOAL #2   Title pt will be able to hold her dog with use of Rt hand    Baseline unable at eval    Time 12    Period Weeks    Status New    Target Date 05/16/21      PT LONG  TERM GOAL #3   Title pt will return to boxing with understanding of limitations & respect to keep pain <=2/10    Baseline unable at eval    Time 12    Period Weeks    Status New    Target Date 05/16/21      PT LONG TERM GOAL #4   Title pt will return to yoga and running without limitations by elbow pain    Baseline unable at eval    Time 12    Period Weeks    Status New    Target Date 05/16/21                 Plan - 04/11/21 1422    Clinical Impression Statement Edema noted around lateral epicondyle with soreness again today. Manual therapy to decr muscle belly tension and edema mob to decrease swelling in lat epicondyle. Will cancel one apt next week to give it a little break and will wear both braces for the next little bit to bring down irritaiton.    PT Treatment/Interventions ADLs/Self Care Home Management;Cryotherapy;Electrical Stimulation;Functional mobility training;Moist Heat;Therapeutic activities;Therapeutic exercise;Neuromuscular re-education;Passive range of motion;Scar mobilization;Patient/family education;Manual techniques;Dry needling;Taping;Vasopneumatic Device    PT Next Visit Plan re-eval, did swelling decr?    PT [place], ice frequently through day    Consulted and Agree with Plan of Care Patient           Patient will benefit from  skilled therapeutic intervention in order to improve the following deficits and impairments:  Impaired UE functional use,Increased muscle spasms,Decreased activity tolerance,Pain,Impaired flexibility,Decreased strength  Visit Diagnosis: Pain in right elbow  Muscle weakness (generalized)     Problem List Patient Active Problem List   Diagnosis Date Noted  . Right tennis elbow 01/16/2021  . Cervicalgia 01/03/2019  . Migraine without aura and without status migrainosus, not intractable 10/14/2018  . GERD (gastroesophageal reflux disease) 02/14/2012  . Asthma     Vondra Aldredge C. Melodee Lupe PT, DPT 04/11/21 2:25 PM   [facility] [address] [place], [place], [address] Phone: [phone]   Fax:  [phone]  Name: [name] [name] MRN: [id number] Date of Birth: [birth year]

## 2021-04-16 ENCOUNTER — Encounter (HOSPITAL_BASED_OUTPATIENT_CLINIC_OR_DEPARTMENT_OTHER): Payer: 59 | Admitting: Physical Therapy

## 2021-04-18 ENCOUNTER — Encounter (HOSPITAL_BASED_OUTPATIENT_CLINIC_OR_DEPARTMENT_OTHER): Payer: Self-pay | Admitting: Physical Therapy

## 2021-04-18 ENCOUNTER — Ambulatory Visit (HOSPITAL_BASED_OUTPATIENT_CLINIC_OR_DEPARTMENT_OTHER): Payer: 59 | Admitting: Physical Therapy

## 2021-04-18 ENCOUNTER — Other Ambulatory Visit: Payer: Self-pay

## 2021-04-18 DIAGNOSIS — M25521 Pain in right elbow: Secondary | ICD-10-CM | POA: Diagnosis not present

## 2021-04-18 DIAGNOSIS — M6281 Muscle weakness (generalized): Secondary | ICD-10-CM

## 2021-04-18 NOTE — Therapy (Signed)
[facility] [place], [place], [address] Phone: [phone]   Fax:  [phone]  Physical Therapy Treatment  Patient Details  Name: [name] [name] MRN: [id number] Date of Birth: Jun 14, 1976 Referring Provider (PT): Dwana Melena, [place]   Encounter Date: 04/18/2021   PT End of Session - 04/18/21 1350    Visit Number 10    Number of Visits 25    Date for PT Re-Evaluation 05/16/21    Authorization Type UHC    PT Start Time 0300    PT Stop Time 1428    PT Time Calculation (min) 43 min    Activity Tolerance Patient tolerated treatment well    Behavior During Therapy [facility] for tasks assessed/performed           Past Medical History:  Diagnosis Date  . Asthma   . GERD (gastroesophageal reflux disease)   . Migraine   . Scoliosis   . TIA (transient ischemic attack) 01/2012   migraine variant, no clot but vessel spasm    Past Surgical History:  Procedure Laterality Date  . CESAREAN SECTION  1997   boy  . ELBOW SURGERY      There were no vitals filed for this visit.   Subjective Assessment - 04/18/21 1350    Subjective It feels like I did 6 weeks ago. 2 full days/week in office (6hr).    Patient Stated Goals yoga, run, workout, boxing, walk dog    Currently in Pain? Yes    Pain Score 6     Pain Location Elbow    Pain Orientation Right    Pain Descriptors / Indicators Sore    Aggravating Factors  moving it hurts into biceps    Pain Relieving Factors ice              [facility] PT Assessment - 04/18/21 0001      ROM / Strength   AROM / PROM / Strength PROM      PROM   Overall PROM Comments -10 deg pronation with elbow flexed    PROM Assessment Site Wrist                         [facility] Adult PT Treatment/Exercise - 04/18/21 0001      Manual Therapy   Manual Therapy Joint mobilization    Edema Management lateral epicondyle    Joint Mobilization radial mobs with pronation    Soft tissue  mobilization Rt wrist extensors    Other Manual Therapy edu on self mobs for proximal radial head      Kinesiotix   Edema Rt lat epicondyle                  PT Education - 04/18/21 1622    Education Details need for pronation in typing, POC & setbacks    Person(s) Educated Patient    Methods Explanation;Demonstration;Tactile cues    Comprehension Verbalized understanding;Returned demonstration;Need further instruction            PT Short Term Goals - 03/26/21 1456      PT SHORT TERM GOAL #1   Title able to type for at least half day at work <=2/10 pain    Baseline tighness and discomfort with slight pain    Status Partially Met      PT SHORT TERM GOAL #2   Title able to tolerance static, body weight CKC pressure through UEs    Status Achieved  PT SHORT TERM GOAL #3   Title pt will use Rt hand for light ADLs such as washing dishes and carrying small objects <=5lb comfortably    Baseline using Rt arm for ADLs. able to carry 5lb but would not call it comfortable.    Status Partially Met             PT Long Term Goals - 02/21/21 1643      PT LONG TERM GOAL #1   Title grip strength equal Rt to Lt    Baseline see flowsheet    Time 12    Period Weeks    Status New    Target Date 05/16/21      PT LONG TERM GOAL #2   Title pt will be able to hold her dog with use of Rt hand    Baseline unable at eval    Time 12    Period Weeks    Status New    Target Date 05/16/21      PT LONG TERM GOAL #3   Title pt will return to boxing with understanding of limitations & respect to keep pain <=2/10    Baseline unable at eval    Time 12    Period Weeks    Status New    Target Date 05/16/21      PT LONG TERM GOAL #4   Title pt will return to yoga and running without limitations by elbow pain    Baseline unable at eval    Time 12    Period Weeks    Status New    Target Date 05/16/21                 Plan - 04/18/21 1618    Clinical Impression  Statement Edema continues at lateral epicondyle so Ktape was replaced at the end of the session. Noted to have decreased proximal radial mobility limiting ability to create necessary pronation for typing. Measured a lack of 10 deg with elbow anchored.  reported feeling reduced stiffness following treatment. is interested in obtaining referral to treat Lt arm as well as it is getting very tight.    PT Treatment/Interventions ADLs/Self Care Home Management;Cryotherapy;Electrical Stimulation;Functional mobility training;Moist Heat;Therapeutic activities;Therapeutic exercise;Neuromuscular re-education;Passive range of motion;Scar mobilization;Patient/family education;Manual techniques;Dry needling;Taping;Vasopneumatic Device    PT Next Visit Plan cont radial mobs, pronation strength for pull    PT Home Exercise Plan [id number], ice frequently through day    Consulted and Agree with Plan of Care Patient           Patient will benefit from skilled therapeutic intervention in order to improve the following deficits and impairments:  Impaired UE functional use,Increased muscle spasms,Decreased activity tolerance,Pain,Impaired flexibility,Decreased strength  Visit Diagnosis: Pain in right elbow  Muscle weakness (generalized)     Problem List Patient Active Problem List   Diagnosis Date Noted  . Right tennis elbow 01/16/2021  . Cervicalgia 01/03/2019  . Migraine without aura and without status migrainosus, not intractable 10/14/2018  . GERD (gastroesophageal reflux disease) 02/14/2012  . Asthma     Lauri Till C. Seraphina Mitchner PT, DPT 04/18/21 4:23 PM   [facility] [address] [place], [place], [address] Phone: [phone]   Fax:  [phone]  Name: [name] [name] MRN: [id number] Date of Birth: 08/07/1976

## 2021-04-22 ENCOUNTER — Encounter (HOSPITAL_BASED_OUTPATIENT_CLINIC_OR_DEPARTMENT_OTHER): Payer: Self-pay | Admitting: Physical Therapy

## 2021-04-22 ENCOUNTER — Other Ambulatory Visit: Payer: Self-pay

## 2021-04-22 ENCOUNTER — Ambulatory Visit (HOSPITAL_BASED_OUTPATIENT_CLINIC_OR_DEPARTMENT_OTHER): Payer: 59 | Admitting: Physical Therapy

## 2021-04-22 DIAGNOSIS — M25521 Pain in right elbow: Secondary | ICD-10-CM | POA: Diagnosis not present

## 2021-04-22 DIAGNOSIS — M6281 Muscle weakness (generalized): Secondary | ICD-10-CM

## 2021-04-22 NOTE — Therapy (Addendum)
[facility] [address] [place], [place], [address] Phone: [phone]   Fax:  [phone]  Physical Therapy Treatment/Discharge  Patient Details  Name: Glendora Clouatre MRN: [id number] Date of Birth: 08/28/1976 Referring Provider (PT): Dwana Melena, [place]   Encounter Date: 04/22/2021   PT End of Session - 04/22/21 1555     Visit Number 11    Number of Visits 25    Date for PT Re-Evaluation 05/16/21    Authorization Type UHC    PT Start Time 1518    PT Stop Time 1555    PT Time Calculation (min) 37 min    Activity Tolerance Patient tolerated treatment well    Behavior During Therapy [facility] for tasks assessed/performed             Past Medical History:  Diagnosis Date   Asthma    GERD (gastroesophageal reflux disease)    Migraine    Scoliosis    TIA (transient ischemic attack) 01/2012   migraine variant, no clot but vessel spasm    Past Surgical History:  Procedure Laterality Date   CESAREAN SECTION  1997   boy   ELBOW SURGERY      There were no vitals filed for this visit.   Subjective Assessment - 04/22/21 1520     Subjective It is really right and typing is hurting. I have to ice it at the end of the day.    Patient Stated Goals yoga, run, workout, boxing, walk dog    Currently in Pain? No/denies    Aggravating Factors  typing    Pain Relieving Factors ice                [facility] PT Assessment - 04/22/21 0001       Assessment   Medical Diagnosis right tennis elbow release with repair of common extensor tendon    Referring Provider (PT) Dwana Melena, PA-C                           [facility] Adult PT Treatment/Exercise - 04/22/21 0001       Modalities   Modalities Ultrasound      Cryotherapy   Number Minutes Cryotherapy 10 Minutes    Cryotherapy Location --   elbow   Type of Cryotherapy Ice massage      Ultrasound   Ultrasound Location Rt lat elbow    Ultrasound Parameters  50%, .8w/cm2    Ultrasound Goals Edema      Manual Therapy   Soft tissue mobilization IASTM wrist extensors    Kinesiotex --   Lat epicondyle X support with cross pressure over extensors                     PT Short Term Goals - 03/26/21 1456       PT SHORT TERM GOAL #1   Title able to type for at least half day at work <=2/10 pain    Baseline tighness and discomfort with slight pain    Status Partially Met      PT SHORT TERM GOAL #2   Title able to tolerance static, body weight CKC pressure through UEs    Status Achieved      PT SHORT TERM GOAL #3   Title pt will use Rt hand for light ADLs such as washing dishes and carrying small objects <=5lb comfortably    Baseline using Rt arm for ADLs. able  to carry 5lb but would not call it comfortable.    Status Partially Met               PT Long Term Goals - 02/21/21 1643       PT LONG TERM GOAL #1   Title grip strength equal Rt to Lt    Baseline see flowsheet    Time 12    Period Weeks    Status New    Target Date 05/16/21      PT LONG TERM GOAL #2   Title pt will be able to hold her dog with use of Rt hand    Baseline unable at eval    Time 12    Period Weeks    Status New    Target Date 05/16/21      PT LONG TERM GOAL #3   Title pt will return to boxing with understanding of limitations & respect to keep pain <=2/10    Baseline unable at eval    Time 12    Period Weeks    Status New    Target Date 05/16/21      PT LONG TERM GOAL #4   Title pt will return to yoga and running without limitations by elbow pain    Baseline unable at eval    Time 12    Period Weeks    Status New    Target Date 05/16/21                   Plan - 04/22/21 1709     Clinical Impression Statement Cont to experience significant edema and tightness in extensors. She is wearing both her wrist splint and elbow compression brace- is going to add wrist support at keyboard and mouse as soon as she gets the pads.  asked her to ice during the day while she is typing in attempt to decrease irritation.    PT Treatment/Interventions ADLs/Self Care Home Management;Cryotherapy;Electrical Stimulation;Functional mobility training;Moist Heat;Therapeutic activities;Therapeutic exercise;Neuromuscular re-education;Passive range of motion;Scar mobilization;Patient/family education;Manual techniques;Dry needling;Taping;Vasopneumatic Device    PT Next Visit Plan cont to monitor edema, outcome of [place]?    PT [place], ice frequently through day    Consulted and Agree with Plan of Care Patient             Patient will benefit from skilled therapeutic intervention in order to improve the following deficits and impairments:  Impaired UE functional use,Increased muscle spasms,Decreased activity tolerance,Pain,Impaired flexibility,Decreased strength  Visit Diagnosis: Pain in right elbow  Muscle weakness (generalized)     Problem List Patient Active Problem List   Diagnosis Date Noted   Right tennis elbow 01/16/2021   Cervicalgia 01/03/2019   Migraine without aura and without status migrainosus, not intractable 10/14/2018   GERD (gastroesophageal reflux disease) 02/14/2012   Asthma    Barbarann Kelly C. Carmie Lanpher PT, DPT 04/22/21 5:12 PM   [facility] [address] [place], [place], [address] Phone: [phone]   Fax:  [phone]  Name: Eleina Jergens MRN: [id number] Date of Birth: [birth year]  PHYSICAL THERAPY DISCHARGE SUMMARY  Visits from Start of Care: 11  Current functional level related to goals / functional outcomes: See above   Remaining deficits: See above   Education / Equipment: Anatomy of condition, POC, HEP, exercise form/rationale   Patient agrees to discharge. Patient goals were partially met. Patient is being discharged due to not returning since the last visit. Tyechia Allmendinger C. Isamu Trammel PT, DPT  07/24/21 12:08 PM

## 2021-04-23 ENCOUNTER — Encounter (HOSPITAL_BASED_OUTPATIENT_CLINIC_OR_DEPARTMENT_OTHER): Payer: 59 | Admitting: Physical Therapy

## 2021-04-23 ENCOUNTER — Telehealth: Payer: Self-pay | Admitting: Orthopaedic Surgery

## 2021-04-23 NOTE — Telephone Encounter (Signed)
Yes she can be cut down to 4 hours.  Let's stop therapy for a week to let things calm down.

## 2021-04-23 NOTE — Telephone Encounter (Signed)
Pt wants to know if someone can give her a call about her elbow. She states her therapist put in the note that her arm is still very swollen still and she having difficulty with it. Also she is wondering if she can be cut down to 4 hours a day for work because its a lot for her.

## 2021-04-23 NOTE — Telephone Encounter (Signed)
Do you want to see her sooner?

## 2021-04-23 NOTE — Telephone Encounter (Signed)
Called and talked to pt. Work note completed and and placed at the front desk

## 2021-04-25 ENCOUNTER — Ambulatory Visit (HOSPITAL_BASED_OUTPATIENT_CLINIC_OR_DEPARTMENT_OTHER): Payer: 59 | Admitting: Physical Therapy

## 2021-04-30 ENCOUNTER — Encounter (HOSPITAL_BASED_OUTPATIENT_CLINIC_OR_DEPARTMENT_OTHER): Payer: 59 | Admitting: Physical Therapy

## 2021-05-02 ENCOUNTER — Encounter (HOSPITAL_BASED_OUTPATIENT_CLINIC_OR_DEPARTMENT_OTHER): Payer: 59 | Admitting: Physical Therapy

## 2021-05-07 ENCOUNTER — Encounter (HOSPITAL_BASED_OUTPATIENT_CLINIC_OR_DEPARTMENT_OTHER): Payer: 59 | Admitting: Physical Therapy

## 2021-05-08 ENCOUNTER — Other Ambulatory Visit: Payer: Self-pay

## 2021-05-08 ENCOUNTER — Ambulatory Visit (INDEPENDENT_AMBULATORY_CARE_PROVIDER_SITE_OTHER): Payer: 59 | Admitting: Physician Assistant

## 2021-05-08 ENCOUNTER — Encounter: Payer: Self-pay | Admitting: Orthopaedic Surgery

## 2021-05-08 DIAGNOSIS — M7711 Lateral epicondylitis, right elbow: Secondary | ICD-10-CM

## 2021-05-08 NOTE — Progress Notes (Signed)
Post-Op Visit Note   Patient: [name] [name]           Date of Birth: 05/20/1976           MRN: [id number] Visit Date: 05/08/2021 PCP: Milus Height, PA   Assessment & Plan:  Chief Complaint:  Chief Complaint  Patient presents with   Right Elbow - Pain   Visit Diagnoses:  1. Right tennis elbow     Plan: Patient is a very pleasant 45 year old female who comes in today 4 months status post right tennis elbow release and tendon repair, date of surgery 01/16/2021.  She was initially doing well, but increased lifting to 5 pounds in physical therapy as well as returned back to work for 6-hour days typing which seems to aggravate her symptoms.  She is having burning and tightness to the lateral epicondyle and into the forearm.  She notes swelling to the lateral elbow which improved when she went back to working 4-hour days.  Examination of her right upper extremity reveals mild tenderness to the lateral epicondyle.  Minimal tenderness to the radial tunnel.  She has no pain with gripping, painless resisted wrist extension painless supination or pronation.  At this point, I believe she just needs more time with continued restrictions of working no more than 4 hours a day for the next 6 weeks.  We will also have her continue with physical therapy but not lift more than a few pounds.  She will follow-up with [place] in 6 weeks time for recheck.  Call with concerns or questions.  Follow-Up Instructions: Return if symptoms worsen or fail to improve.   Orders:  No orders of the defined types were placed in this encounter.  No orders of the defined types were placed in this encounter.   Imaging: No new imaging  PMFS History: Patient Active Problem List   Diagnosis Date Noted   Right tennis elbow 01/16/2021   Cervicalgia 01/03/2019   Migraine without aura and without status migrainosus, not intractable 10/14/2018   GERD (gastroesophageal reflux disease) 02/14/2012   Asthma    Past Medical History:   Diagnosis Date   Asthma    GERD (gastroesophageal reflux disease)    Migraine    Scoliosis    TIA (transient ischemic attack) 01/2012   migraine variant, no clot but vessel spasm    Family History  Problem Relation Age of Onset   Diabetes Father    Hypertension Father    Hyperlipidemia Father    Cancer Father        prostate   Heart attack Father        x 2   Cancer Maternal Uncle        STOMACH   Diabetes Maternal Uncle    Cancer Maternal Grandfather        LUNG   Cancer Paternal Grandmother        stomach   Cancer Paternal Grandfather        lung    Past Surgical History:  Procedure Laterality Date   CESAREAN SECTION  1997   boy   ELBOW SURGERY     Social History   Occupational History    Comment: [employer], [employer]  Tobacco Use   Smoking status: Former    Years: 10.00    Pack years: 0.00    Types: Cigarettes    Quit date: 12/12/2005    Years since quitting: 15.4   Smokeless tobacco: Never  Vaping Use   Vaping Use:  Never used  Substance and Sexual Activity   Alcohol use: Yes    Alcohol/week: 0.0 standard drinks    Comment: 10/14/18 2-3 x month   Drug use: No   Sexual activity: Yes    Partners: Male    Birth control/protection: Rhythm, Condom    Comment: 1st intercourse 45 yo-More than 5 partners

## 2021-05-09 ENCOUNTER — Ambulatory Visit (HOSPITAL_BASED_OUTPATIENT_CLINIC_OR_DEPARTMENT_OTHER): Payer: 59 | Admitting: Physical Therapy

## 2021-05-14 ENCOUNTER — Ambulatory Visit (HOSPITAL_BASED_OUTPATIENT_CLINIC_OR_DEPARTMENT_OTHER): Payer: 59 | Admitting: Physical Therapy

## 2021-05-16 ENCOUNTER — Ambulatory Visit (HOSPITAL_BASED_OUTPATIENT_CLINIC_OR_DEPARTMENT_OTHER): Payer: 59 | Admitting: Physical Therapy

## 2021-05-21 ENCOUNTER — Encounter (HOSPITAL_BASED_OUTPATIENT_CLINIC_OR_DEPARTMENT_OTHER): Payer: 59 | Admitting: Physical Therapy

## 2021-06-03 ENCOUNTER — Telehealth: Payer: Self-pay | Admitting: Orthopaedic Surgery

## 2021-06-03 NOTE — Telephone Encounter (Signed)
05/08/21 ov note & work note faxed to [employer] [phone]

## 2021-06-12 ENCOUNTER — Ambulatory Visit: Payer: 59 | Admitting: Orthopaedic Surgery

## 2021-06-12 ENCOUNTER — Encounter: Payer: Self-pay | Admitting: Orthopaedic Surgery

## 2021-06-12 ENCOUNTER — Other Ambulatory Visit: Payer: Self-pay

## 2021-06-12 DIAGNOSIS — M7711 Lateral epicondylitis, right elbow: Secondary | ICD-10-CM

## 2021-06-12 NOTE — Progress Notes (Signed)
   Post-Op Visit Note   Patient: [name] [name]           Date of Birth: 01-19-1976           MRN: [id number] Visit Date: 06/12/2021 PCP: Milus Height, PA   Assessment & Plan:  Chief Complaint:  Chief Complaint  Patient presents with   Right Elbow - Pain   Visit Diagnoses:  1. Right tennis elbow     Plan: [name] [name] is approximately 5 months status post right tennis elbow release and tendon repair.  She is doing much better.  She does not wear a brace at home.  She feels like the rest from physical therapy also helped as well.  She is starting a new job August 1.  Overall very happy.  Right elbow shows full healed surgical scar.  Excellent range of motion.  No discomfort with passive stretch of the wrist.  From my standpoint [name] [name] is doing well and she can ease back into physical therapy and her exercises and activities.  I would recommend staying away from boxing for at least another month.  Overall I am happy with her recovery and progress.  She can follow-up with [place] as needed.  Follow-Up Instructions: No follow-ups on file.   Orders:  No orders of the defined types were placed in this encounter.  No orders of the defined types were placed in this encounter.   Imaging: No results found.  PMFS History: Patient Active Problem List   Diagnosis Date Noted   Right tennis elbow 01/16/2021   Cervicalgia 01/03/2019   Migraine without aura and without status migrainosus, not intractable 10/14/2018   GERD (gastroesophageal reflux disease) 02/14/2012   Asthma    Past Medical History:  Diagnosis Date   Asthma    GERD (gastroesophageal reflux disease)    Migraine    Scoliosis    TIA (transient ischemic attack) 01/2012   migraine variant, no clot but vessel spasm    Family History  Problem Relation Age of Onset   Diabetes Father    Hypertension Father    Hyperlipidemia Father    Cancer Father        prostate   Heart attack Father        x 2   Cancer Maternal Uncle         STOMACH   Diabetes Maternal Uncle    Cancer Maternal Grandfather        LUNG   Cancer Paternal Grandmother        stomach   Cancer Paternal Grandfather        lung    Past Surgical History:  Procedure Laterality Date   CESAREAN SECTION  1997   boy   ELBOW SURGERY     Social History   Occupational History    Comment: [employer], [employer]  Tobacco Use   Smoking status: Former    Years: 10.00    Types: Cigarettes    Quit date: 12/12/2005    Years since quitting: 15.5   Smokeless tobacco: Never  Vaping Use   Vaping Use: Never used  Substance and Sexual Activity   Alcohol use: Yes    Alcohol/week: 0.0 standard drinks    Comment: 10/14/18 2-3 x month   Drug use: No   Sexual activity: Yes    Partners: Male    Birth control/protection: Rhythm, Condom    Comment: 1st intercourse 45 yo-More than 5 partners

## 2021-06-19 ENCOUNTER — Ambulatory Visit: Payer: 59 | Admitting: Orthopaedic Surgery

## 2021-07-24 ENCOUNTER — Telehealth: Payer: Self-pay | Admitting: *Deleted

## 2021-07-24 NOTE — Telephone Encounter (Signed)
[employer] PA, key: [id number], [id number], failed topamax (caused weight loss, drowsimess), maxalt, diclofenac, is on wellbutrin and has history of TIA. PA immediately approved. Coverage Start Date:06/24/2021; Coverage End Date:07/24/2022. Faxed approval to pharmacy.

## 2021-10-07 ENCOUNTER — Telehealth: Payer: Self-pay | Admitting: Diagnostic Neuroimaging

## 2021-10-07 ENCOUNTER — Other Ambulatory Visit: Payer: Self-pay | Admitting: *Deleted

## 2021-10-07 MED ORDER — EMGALITY 120 MG/ML ~~LOC~~ SOAJ: 120.0000 mg | SUBCUTANEOUS | 0 refills | Status: DC

## 2021-10-07 NOTE — Telephone Encounter (Signed)
Called patient and informed her the refill was sent today to mail order. She stated she got a call from them, and they are shipping it today. Patient verbalized understanding, appreciation.

## 2021-10-07 NOTE — Telephone Encounter (Signed)
Pt called, insurance only allowed me chose 2 injections of Galcanezumab-gnlm (EMGALITY) 120 MG/ML SOAJ at the pharmacy rest prescription must be mail ordered. [facility] faxed an approval for a 90-day supply to [facility] . Need approved as soon as possible because 8 days late taking medication

## 2021-10-20 NOTE — Patient Instructions (Incomplete)
Below is our plan:  We will continue Emgality and rizatriptan.   Please make sure you are staying well hydrated. I recommend 50-60 ounces daily. Well balanced diet and regular exercise encouraged. Consistent sleep schedule with 6-8 hours recommended.   Please continue follow up with care team as directed.   Follow up with me in 1 year   You may receive a survey regarding today's visit. I encourage you to leave honest feed back as I do use this information to improve patient care. Thank you for seeing me today!    

## 2021-10-20 NOTE — Progress Notes (Deleted)
   PATIENT: [name] [name] DOB: 1976/03/04  REASON FOR VISIT: follow up HISTORY FROM: patient  Virtual Visit via Telephone Note  I connected with Deniece Portela on 10/20/21 at  9:00 AM EST by telephone and verified that I am speaking with the correct person using two identifiers.   I discussed the limitations, risks, security and privacy concerns of performing an evaluation and management service by telephone and the availability of in person appointments. I also discussed with the patient that there may be a patient responsible charge related to this service. The patient expressed understanding and agreed to proceed.   History of Present Illness:  10/20/21 ALL: [name] [name] is a 45 y.o. female here today for follow up migraines. She continues Emgality and rizatriptan.    History (copied from Dr Richrd Humbles previous note)  UPDATE (10/21/20, VRP): Since last visit, doing well. Symptoms are improved. Avg 1 HA every other month. No alleviating or aggravating factors. Tolerating meds.   UPDATE (09/27/19, VRP): Since last visit, doing well on emgality. Symptoms are improved. Severity is mild. No alleviating or aggravating factors. Tolerating meds. Avg 2-3 HA per month now.   UPDATE (02/14/19, VRP): Since last visit, doing well. Tried TPX, but stopped due to side effects (tired, decr appetite). Now on emgality and doing well. Avg 2 HA per week. Rizatriptan helps.    NEW HPI 10/14/18: 45 year old female here for evaluation of migraines.  Patient previously seen in 2014.  Now patient having 3-4 headaches per week since July 2019.  He describes constant left-sided frontal and parietal throbbing pain with nausea and photophobia.  Some neck pain.  She has been prescribed diclofenac and promethazine with mild relief.  No specific triggering or aggravating factors.  No vision changes, blurred vision or vision loss.  No unilateral numbness or tingling.   Observations/Objective:  Generalized: Well developed, in  no acute distress  Mentation: Alert oriented to time, place, history taking. Follows all commands speech and language fluent   Assessment and Plan:  45 y.o. year old female  has a past medical history of Asthma, GERD (gastroesophageal reflux disease), Migraine, Scoliosis, and TIA (transient ischemic attack) (01/2012). here with  No diagnosis found.  No orders of the defined types were placed in this encounter.   No orders of the defined types were placed in this encounter.    Follow Up Instructions:  I discussed the assessment and treatment plan with the patient. The patient was provided an opportunity to ask questions and all were answered. The patient agreed with the plan and demonstrated an understanding of the instructions.   The patient was advised to call back or seek an in-person evaluation if the symptoms worsen or if the condition fails to improve as anticipated.  I provided *** minutes of non-face-to-face time during this encounter. Patient located at their place of residence during Mychart visit. Provider is in the office.    Shawnie Dapper, NP

## 2021-10-21 ENCOUNTER — Telehealth: Payer: Self-pay | Admitting: Family Medicine

## 2022-02-06 ENCOUNTER — Telehealth: Payer: Self-pay | Admitting: Diagnostic Neuroimaging

## 2022-02-06 NOTE — Telephone Encounter (Signed)
..   Pt understands that although there may be some limitations with this type of visit, we will take all precautions to reduce any security or privacy concerns.  Pt understands that this will be treated like an in office visit and we will file with pt's insurance, and there may be a patient responsible charge related to this service. ? ?

## 2022-02-06 NOTE — Telephone Encounter (Signed)
Pt is requesting a refill for Galcanezumab-gnlm (EMGALITY) 120 MG/ML SOAJ. ? ?Pharmacy: [facility]  ? ?

## 2022-02-09 ENCOUNTER — Other Ambulatory Visit: Payer: Self-pay

## 2022-02-09 MED ORDER — EMGALITY 120 MG/ML ~~LOC~~ SOAJ: 120.0000 mg | SUBCUTANEOUS | 0 refills | Status: DC

## 2022-02-09 NOTE — Telephone Encounter (Signed)
Approved, pt must complete FU for further refills  ?

## 2022-03-25 ENCOUNTER — Ambulatory Visit: Payer: 59 | Admitting: Nurse Practitioner

## 2022-03-31 ENCOUNTER — Encounter: Payer: Self-pay | Admitting: Family Medicine

## 2022-03-31 ENCOUNTER — Telehealth (INDEPENDENT_AMBULATORY_CARE_PROVIDER_SITE_OTHER): Payer: 59 | Admitting: Family Medicine

## 2022-03-31 DIAGNOSIS — G43009 Migraine without aura, not intractable, without status migrainosus: Secondary | ICD-10-CM

## 2022-03-31 MED ORDER — RIZATRIPTAN BENZOATE 10 MG PO TBDP: 10.0000 mg | ORAL_TABLET | ORAL | 11 refills | Status: AC | PRN

## 2022-03-31 MED ORDER — EMGALITY 120 MG/ML ~~LOC~~ SOAJ: 120.0000 mg | SUBCUTANEOUS | 3 refills | Status: DC

## 2022-03-31 NOTE — Patient Instructions (Signed)
Below is our plan:  We will continue Emgality every 30 days and rizatriptan as needed   Please make sure you are staying well hydrated. I recommend 50-60 ounces daily. Well balanced diet and regular exercise encouraged. Consistent sleep schedule with 6-8 hours recommended.   Please continue follow up with care team as directed.   Follow up with me in 1 year   You may receive a survey regarding today's visit. I encourage you to leave honest feed back as I do use this information to improve patient care. Thank you for seeing me today!    

## 2022-03-31 NOTE — Progress Notes (Signed)
? ?PATIENT: [name] [name] ?DOB: [birth year] ? ?REASON FOR VISIT: follow up ?HISTORY FROM: patient ? ?Virtual Visit via Telephone Note ? ?I connected with [name] on 03/31/22 at  9:15 AM EDT by telephone and verified that I am speaking with the correct person using two identifiers. ?  ?I discussed the limitations, risks, security and privacy concerns of performing an evaluation and management service by telephone and the availability of in person appointments. I also discussed with the patient that there may be a patient responsible charge related to this service. The patient expressed understanding and agreed to proceed. ? ? ?History of Present Illness: ? ?03/31/22 ALL: ?[name] [name] is a 46 y.o. female here today for follow up for migraines. She was last seen by Dr Leta Baptist 09/2020 and doing very well. She continues Emgality and rizatriptan. She is doing very well. She may have a breakthrough migraine toward end of treatment cycle. She has not used rizatriptan recently due to not having refills. OTC medicaitons help with abortive therapy.  ? ?History (copied from Dr Gladstone Lighter previous note) ? ?UPDATE (10/21/20, VRP): Since last visit, doing well. Symptoms are improved. Avg 1 HA every other month. No alleviating or aggravating factors. Tolerating meds. ?  ?UPDATE (09/27/19, VRP): Since last visit, doing well on emgality. Symptoms are improved. Severity is mild. No alleviating or aggravating factors. Tolerating meds. Avg 2-3 HA per month now. ?  ?UPDATE (02/14/19, VRP): Since last visit, doing well. Tried TPX, but stopped due to side effects (tired, decr appetite). Now on emgality and doing well. Avg 2 HA per week. Rizatriptan helps.  ?  ?NEW HPI 10/14/18: 46 year old female here for evaluation of migraines.  Patient previously seen in 2014.  Now patient having 3-4 headaches per week since July 2019.  He describes constant left-sided frontal and parietal throbbing pain with nausea and photophobia.  Some neck pain.  She  has been prescribed diclofenac and promethazine with mild relief.  No specific triggering or aggravating factors.  No vision changes, blurred vision or vision loss.  No unilateral numbness or tingling. ?  ?UPDATE 01/02/13: Doing well. No new events of numbness. Has had 2 episodes of waking up at not, not feeling right, heart racing, and diff falling asleep again. Has new job (promotion) and is surrounded by more smokers. ?  ?UPDATE 06/14/12:  She denies any further episodes of numbness. She reports having a headache last week for 2 days, she took Ibuprofen 1 tab for relief.  She does report having a history of migraines in the past but has not taken any prophylactic medications.  They generally occur during her menses.  Tolerating ASA 81mg  well without bruising.  She is now exercising 4-5 days per week.   ?  ?EEG 03/28/12 normal ?  ?Prolonged cardiac monitor 5/5-5/29/13 shows sinus rhythm, occasional sinus bradycardia.  No sustained arrhythmia. ?  ?PRIOR HPI (03/15/12): 46 year old right handed female with a past medical history of controlled hyperlipidemia with diet and exercise, asthma, and GERD here for evaluation of 2 seperate episodes of left arm and left leg numbness in March 2013.  The first episode she was sitting eating at a restaraunt when she suddenly became numbness to the left side of her face arm and leg, she became disoriented, confused and felt like she could not breathe with nausea. She was unable to begin with her husband while he was talking to her. She then went to the emergency department and within the hour her symptoms have  resolved.  She reports feeling lightheaded and fatigued after the episode. Denies headache, visual disturbances or loss of bowel bladder. She was admitted to the hospital for 3 days her CTA of head and neck, MRI of the brain were normal. TTE showed 55-65% EF, no source of embolus.   ?   ?After being home for several days she had another episode with left facial and arm numbness  for approximately half an hour while at work, her coworker says she was pale and gray.  She reports she had difficulty breathing at that time as well. She does report having a headache to the left side of her head afterwards.  She went to the emergency department and was sent home. Reports she has been eating and drinking well sleeps approximately 6-7 hours per night but awakens every 2 hours but goes back to sleep easily. Denies any fever, illnesses, episodes of d?j? vu, staring spells, meningitis or encephalitis or family history of seizures. She exercises regularly at least 3 times a week but has not since her last episode due to being nervous. ?  ?She reports 2 days before her symptoms occurred she seen a chiropractor in which she had  left neck manipulation with increased muscle tension.  Denies that she has been under increased stress "out of the ordinary". ? ? ?Observations/Objective: ? ?Generalized: Well developed, in no acute distress  ?Mentation: Alert oriented to time, place, history taking. Follows all commands speech and language fluent ? ? ?Assessment and Plan: ? ?46 y.o. year old female  has a past medical history of Asthma, GERD (gastroesophageal reflux disease), Migraine, Scoliosis, and TIA (transient ischemic attack) (01/2012). here with ? ?  ICD-10-CM   ?1. Migraine without aura and without status migrainosus, not intractable  G43.009   ?  ? ? ?[name] [name] is doing well on Emgality. She will continue every 30 days. May use rizatriptan for abortive therapy as needed. Healthy lifestyle habits encouraged. She will follow up in 1 year, sooner if needed.  ? ?No orders of the defined types were placed in this encounter. ? ? ?Meds ordered this encounter  ?Medications  ? rizatriptan (MAXALT-MLT) 10 MG disintegrating tablet  ?  Sig: Take 1 tablet (10 mg total) by mouth as needed for migraine. May repeat in 2 hours if needed  ?  Dispense:  9 tablet  ?  Refill:  11  ?  Order Specific Question:   Supervising  Provider  ?  AnswerMelvenia Beam [id number]  ? Galcanezumab-gnlm (EMGALITY) 120 MG/ML SOAJ  ?  Sig: Inject 120 mg into the skin every 30 (thirty) days.  ?  Dispense:  3 mL  ?  Refill:  3  ?  Order Specific Question:   Supervising Provider  ?  AnswerMelvenia Beam [id number]  ? ? ? ?Follow Up Instructions: ? ?I discussed the assessment and treatment plan with the patient. The patient was provided an opportunity to ask questions and all were answered. The patient agreed with the plan and demonstrated an understanding of the instructions. ?  ?The patient was advised to call back or seek an in-person evaluation if the symptoms worsen or if the condition fails to improve as anticipated. ? ?I provided 15 minutes of non-face-to-face time during this encounter. Patient located at their place of residence during [place] visit. Provider is in the office.  ? ? ?Debbora Presto, NP  ?

## 2022-04-08 ENCOUNTER — Encounter: Payer: Self-pay | Admitting: Nurse Practitioner

## 2022-04-08 ENCOUNTER — Ambulatory Visit (INDEPENDENT_AMBULATORY_CARE_PROVIDER_SITE_OTHER): Payer: 59 | Admitting: Nurse Practitioner

## 2022-04-08 VITALS — BP 124/78 | Ht 61.5 in | Wt 160.0 lb

## 2022-04-08 DIAGNOSIS — Z113 Encounter for screening for infections with a predominantly sexual mode of transmission: Secondary | ICD-10-CM

## 2022-04-08 DIAGNOSIS — Z01419 Encounter for gynecological examination (general) (routine) without abnormal findings: Secondary | ICD-10-CM | POA: Diagnosis not present

## 2022-04-08 DIAGNOSIS — N898 Other specified noninflammatory disorders of vagina: Secondary | ICD-10-CM

## 2022-04-08 NOTE — Progress Notes (Signed)
? ?  [name] [name] [birth year] VA:[id number] ? ? ?History:  46 y.o. G2P1011 presents for annual exam. Monthly cycles. Normal pap history. Migraine without aura managed by neurology. H/O TIA.  ? ?Gynecologic History ?Patient's last menstrual period was 03/25/2022. ?Period Cycle (Days): 28 ?Period Duration (Days): 6 ?Period Pattern: Regular ?Menstrual Flow: Heavy ?Dysmenorrhea: (!) Moderate ?Dysmenorrhea Symptoms: Cramping ?Contraception/Family planning: condoms and rhythm method ?Sexually active: Yes ? ?Health Maintenance ?Last Pap: 12/30/2018. Results were: Normal, 5-year repeat ?Last mammogram: 11/07/2018. Results were: normal ?Last colonoscopy: Not indicated ?Last Dexa: Not indicated ? ?Past medical history, past surgical history, family history and social history were all reviewed and documented in the EPIC chart. Long-term boyfriend. Works in [employer] information [employer]. 23 yo son.  ? ?ROS:  A ROS was performed and pertinent positives and negatives are included. ? ?Exam: ? ?Vitals:  ? 04/08/22 1605  ?BP: 124/78  ?Weight: 160 lb (72.6 kg)  ?Height: 5' 1.5" (1.562 m)  ? ? ?Body mass index is 29.74 kg/m?. ? ?General appearance:  Normal ?Thyroid:  Symmetrical, normal in size, without palpable masses or nodularity. ?Respiratory ? Auscultation:  Clear without wheezing or rhonchi ?Cardiovascular ? Auscultation:  Regular rate, without rubs, murmurs or gallops ? Edema/varicosities:  Not grossly evident ?Abdominal ? Soft,nontender, without masses, guarding or rebound. ? Liver/spleen:  No organomegaly noted ? Hernia:  None appreciated ? Skin ? Inspection:  Grossly normal ?Breasts: Examined lying and sitting.  ? Right: Without masses, retractions, nipple discharge or axillary adenopathy. ? ? Left: Without masses, retractions, nipple discharge or axillary adenopathy. ?Gentitourinary  ? Inguinal/mons:  Normal without inguinal adenopathy ? External genitalia:  Generalized redness with no masses, tenderness, or lesions ? BUS/Urethra/Skene's glands:   Normal ? Vagina:  Normal appearing with normal color and discharge, no lesions ? Cervix:  Normal appearing without discharge or lesions ? Uterus:  Normal in size, shape and contour.  Midline and mobile, nontender ? Adnexa/parametria:   ?  Rt: Normal in size, without masses or tenderness. ?  Lt: Normal in size, without masses or tenderness. ? Anus and perineum: Normal ? ?Patient informed chaperone available to be present for breast and pelvic exam. Patient has requested no chaperone to be present. Patient has been advised what will be completed during breast and pelvic exam.  ? ?Assessment/Plan:  46 y.o. G2P1011 for annual exam.  ? ?Well female exam with routine gynecological exam - Education provided on SBEs, importance of preventative screenings, current guidelines, high calcium diet, regular exercise, and multivitamin daily. ? ?Screen for STD (sexually transmitted disease) - Plan: SureSwab? Advanced Vaginitis Plus,TMA ? ?Vaginal discharge - Plan: SureSwab? Advanced Vaginitis Plus,TMA ? ?Screening for cervical cancer - Normal Pap history.  Will repeat at 5-year interval per guidelines. ? ?Screening for breast cancer - Normal mammogram history. Overdue for mammogram and plans to schedule this soon. Normal breast exam today. ? ?Screening for colon cancer - average risk. Will start screenings at age 64.  ? ?Return in 1 year for annual. ? ? ? ? ?[place], 4:31 PM 04/08/2022 ? ?

## 2022-04-09 LAB — SURESWAB® ADVANCED VAGINITIS PLUS,TMA
C. trachomatis RNA, TMA: NOT DETECTED
CANDIDA SPECIES: DETECTED — AB
Candida glabrata: NOT DETECTED
N. gonorrhoeae RNA, TMA: NOT DETECTED
SURESWAB(R) ADV BACTERIAL VAGINOSIS(BV),TMA: POSITIVE — AB
TRICHOMONAS VAGINALIS (TV),TMA: NOT DETECTED

## 2022-04-13 ENCOUNTER — Other Ambulatory Visit: Payer: Self-pay | Admitting: Nurse Practitioner

## 2022-04-13 DIAGNOSIS — B3731 Acute candidiasis of vulva and vagina: Secondary | ICD-10-CM

## 2022-04-13 DIAGNOSIS — B9689 Other specified bacterial agents as the cause of diseases classified elsewhere: Secondary | ICD-10-CM

## 2022-04-13 MED ORDER — FLUCONAZOLE 150 MG PO TABS: 150.0000 mg | ORAL_TABLET | ORAL | 0 refills | Status: DC

## 2022-04-13 MED ORDER — METRONIDAZOLE 500 MG PO TABS: 500.0000 mg | ORAL_TABLET | Freq: Two times a day (BID) | ORAL | 0 refills | Status: DC

## 2022-07-14 ENCOUNTER — Encounter: Payer: Self-pay | Admitting: Nurse Practitioner

## 2022-07-14 ENCOUNTER — Ambulatory Visit (INDEPENDENT_AMBULATORY_CARE_PROVIDER_SITE_OTHER): Payer: 59 | Admitting: Nurse Practitioner

## 2022-07-14 VITALS — BP 120/66 | HR 80 | Resp 14 | Ht 62.5 in | Wt 161.0 lb

## 2022-07-14 DIAGNOSIS — N920 Excessive and frequent menstruation with regular cycle: Secondary | ICD-10-CM

## 2022-07-14 DIAGNOSIS — N946 Dysmenorrhea, unspecified: Secondary | ICD-10-CM | POA: Diagnosis not present

## 2022-07-14 DIAGNOSIS — R5383 Other fatigue: Secondary | ICD-10-CM | POA: Diagnosis not present

## 2022-07-14 DIAGNOSIS — R112 Nausea with vomiting, unspecified: Secondary | ICD-10-CM | POA: Diagnosis not present

## 2022-07-14 DIAGNOSIS — N926 Irregular menstruation, unspecified: Secondary | ICD-10-CM

## 2022-07-14 LAB — PREGNANCY, URINE: Preg Test, Ur: NEGATIVE

## 2022-07-14 MED ORDER — NORETHINDRONE 0.35 MG PO TABS: 1.0000 | ORAL_TABLET | Freq: Every day | ORAL | 1 refills | Status: DC

## 2022-07-14 NOTE — Progress Notes (Signed)
   Acute Office Visit  Subjective:    Patient ID: [name] [name], female    DOB: 04-Oct-1976, 46 y.o.   MRN: [id number]   HPI 46 y.o. presents today for intermittent nausea and vomiting, fatigue, and one occasion of night sweats. Menses are regular but are very painful and heavy. She feels the nausea occurs around 1 week to a few days before menses. She is a day or 2 late for menses today. Sexually active. H/O GERD, takes Prilosec 20 mg daily. Denies changes in diet or bowel habits, bloating or gas. Denies vaginal or urinary symptoms.    Review of Systems  Constitutional:  Positive for fatigue. Negative for appetite change, diaphoresis, fever and unexpected weight change.  Gastrointestinal:  Positive for nausea and vomiting. Negative for abdominal distention, abdominal pain, constipation and diarrhea.  Genitourinary:  Positive for menstrual problem. Negative for difficulty urinating, dysuria, flank pain, frequency, hematuria, urgency, vaginal discharge and vaginal pain.       Objective:    Physical Exam Constitutional:      Appearance: Normal appearance.  Abdominal:     Tenderness: There is no abdominal tenderness. There is no right CVA tenderness, left CVA tenderness, guarding or rebound.   GU: not indicated  BP 120/66   Pulse 80   Resp 14   Ht 5' 2.5" (1.588 m)   Wt 161 lb (73 kg)   LMP 06/12/2022   BMI 28.98 kg/m  Wt Readings from Last 3 Encounters:  07/14/22 161 lb (73 kg)  04/08/22 160 lb (72.6 kg)  03/24/21 157 lb (71.2 kg)        UPT negative  Assessment & Plan:   Problem List Items Addressed This Visit   None Visit Diagnoses     Dysmenorrhea    -  Primary   Relevant Medications   norethindrone (MICRONOR) 0.35 MG tablet   Nausea and vomiting, unspecified vomiting type       Menorrhagia with regular cycle       Relevant Medications   norethindrone (MICRONOR) 0.35 MG tablet   Fatigue, unspecified type       Relevant Orders   CBC with Differential/Platelet    Comprehensive metabolic panel   TSH   Late menses       Relevant Orders   Pregnancy, urine      Plan: Negative UPT. Progestin-only contraceptive discussed for management of heavy, painful menses. Will avoid estrogen due to H/O TIA. She would like to try pills first. Will check CBC, CMP, TSH. If no improvement in GI symptoms with better cycle control it is recommended she follow up with GI.      [name] DNP, 12:45 PM 07/14/2022

## 2022-07-15 ENCOUNTER — Other Ambulatory Visit: Payer: Self-pay | Admitting: Nurse Practitioner

## 2022-07-15 DIAGNOSIS — D500 Iron deficiency anemia secondary to blood loss (chronic): Secondary | ICD-10-CM

## 2022-07-15 LAB — CBC WITH DIFFERENTIAL/PLATELET
Absolute Monocytes: 458 cells/uL (ref 200–950)
Basophils Absolute: 38 cells/uL (ref 0–200)
Basophils Relative: 0.5 %
Eosinophils Absolute: 68 cells/uL (ref 15–500)
Eosinophils Relative: 0.9 %
HCT: 33.7 % — ABNORMAL LOW (ref 35.0–45.0)
Hemoglobin: 10.7 g/dL — ABNORMAL LOW (ref 11.7–15.5)
Lymphs Abs: 2025 cells/uL (ref 850–3900)
MCH: 24.4 pg — ABNORMAL LOW (ref 27.0–33.0)
MCHC: 31.8 g/dL — ABNORMAL LOW (ref 32.0–36.0)
MCV: 76.9 fL — ABNORMAL LOW (ref 80.0–100.0)
MPV: 12.4 fL (ref 7.5–12.5)
Monocytes Relative: 6.1 %
Neutro Abs: 4913 cells/uL (ref 1500–7800)
Neutrophils Relative %: 65.5 %
Platelets: 228 10*3/uL (ref 140–400)
RBC: 4.38 10*6/uL (ref 3.80–5.10)
RDW: 14.8 % (ref 11.0–15.0)
Total Lymphocyte: 27 %
WBC: 7.5 10*3/uL (ref 3.8–10.8)

## 2022-07-15 LAB — COMPREHENSIVE METABOLIC PANEL
AG Ratio: 1.9 (calc) (ref 1.0–2.5)
ALT: 13 U/L (ref 6–29)
AST: 16 U/L (ref 10–30)
Albumin: 4.1 g/dL (ref 3.6–5.1)
Alkaline phosphatase (APISO): 57 U/L (ref 31–125)
BUN: 7 mg/dL (ref 7–25)
CO2: 21 mmol/L (ref 20–32)
Calcium: 9.3 mg/dL (ref 8.6–10.2)
Chloride: 107 mmol/L (ref 98–110)
Creat: 0.68 mg/dL (ref 0.50–0.99)
Globulin: 2.2 g/dL (calc) (ref 1.9–3.7)
Glucose, Bld: 86 mg/dL (ref 65–99)
Potassium: 4 mmol/L (ref 3.5–5.3)
Sodium: 141 mmol/L (ref 135–146)
Total Bilirubin: 0.3 mg/dL (ref 0.2–1.2)
Total Protein: 6.3 g/dL (ref 6.1–8.1)

## 2022-07-15 LAB — TSH: TSH: 1.76 mIU/L

## 2022-07-15 MED ORDER — ACCRUFER 30 MG PO CAPS: 1.0000 | ORAL_CAPSULE | Freq: Two times a day (BID) | ORAL | 2 refills | Status: DC

## 2022-08-07 ENCOUNTER — Other Ambulatory Visit (HOSPITAL_BASED_OUTPATIENT_CLINIC_OR_DEPARTMENT_OTHER): Payer: Self-pay | Admitting: Nurse Practitioner

## 2022-08-07 ENCOUNTER — Ambulatory Visit (HOSPITAL_BASED_OUTPATIENT_CLINIC_OR_DEPARTMENT_OTHER)
Admission: RE | Admit: 2022-08-07 | Discharge: 2022-08-07 | Disposition: A | Discharge status: Home / Self Care | Payer: 59 | Source: Ambulatory Visit | Attending: Nurse Practitioner | Admitting: Nurse Practitioner

## 2022-08-07 ENCOUNTER — Encounter (HOSPITAL_BASED_OUTPATIENT_CLINIC_OR_DEPARTMENT_OTHER): Payer: Self-pay | Admitting: Radiology

## 2022-08-07 DIAGNOSIS — Z1231 Encounter for screening mammogram for malignant neoplasm of breast: Secondary | ICD-10-CM | POA: Insufficient documentation

## 2022-08-12 ENCOUNTER — Telehealth: Payer: Self-pay | Admitting: *Deleted

## 2022-08-12 NOTE — Telephone Encounter (Signed)
Patient called is taking Micronor 0.35 mg tablet and iron. Patient said the bleeding is better,however the cramping is severe and over the counter tylenol is not helping with this. Patient asked if you could prescribe something to help with cramping. Please advise

## 2022-08-12 NOTE — Telephone Encounter (Signed)
Ibuprofen is the best for menstrual cramping, so this is recommended over Tylenol if she is able to take. She can take up to 800 mg every 8 hours for cramping. Heating pads also recommended.

## 2022-08-12 NOTE — Telephone Encounter (Signed)
Left detailed message on patient voicemail per DPR access.  

## 2022-08-12 NOTE — Telephone Encounter (Signed)
Patient said she can't take ibuprofen, takes baby aspirin also, history of stroke and injections for migraines. The tylenol she takes is 500 mg tablets she typically takes two tablets, she asked can she take three 500 mg tablets at one time? Please advise

## 2022-08-12 NOTE — Telephone Encounter (Signed)
No, no more than 1000 mg is recommended every 6 hours.

## 2022-08-31 ENCOUNTER — Other Ambulatory Visit: Payer: Self-pay | Admitting: Family Medicine

## 2022-09-01 ENCOUNTER — Telehealth: Payer: Self-pay | Admitting: *Deleted

## 2022-09-01 ENCOUNTER — Other Ambulatory Visit: Payer: Self-pay | Admitting: *Deleted

## 2022-09-01 DIAGNOSIS — G43009 Migraine without aura, not intractable, without status migrainosus: Secondary | ICD-10-CM

## 2022-09-01 MED ORDER — EMGALITY 120 MG/ML ~~LOC~~ SOAJ: 120.0000 mg | SUBCUTANEOUS | 7 refills | Status: DC

## 2022-09-01 NOTE — Telephone Encounter (Signed)
Submitted PA Emgality on CMM. Key: [id number]. Received instant approval. " [id number];Review Type:Prior Auth;Coverage Start Date:08/02/2022;Coverage End Date:09/01/2023; "

## 2022-09-29 ENCOUNTER — Other Ambulatory Visit: Payer: Self-pay

## 2022-09-29 DIAGNOSIS — D500 Iron deficiency anemia secondary to blood loss (chronic): Secondary | ICD-10-CM

## 2022-09-29 NOTE — Progress Notes (Signed)
She will need labs done prior to refills. Orders are in.

## 2022-09-29 NOTE — Progress Notes (Unsigned)
Medication refill request: accufew 30 mg  Last AEX:  04/08/22 Next AEX: 04/13/23 Last MMG (if hormonal medication request): n/a Refill authorized: ***

## 2022-10-12 ENCOUNTER — Telehealth: Payer: Self-pay | Admitting: *Deleted

## 2022-10-12 NOTE — Telephone Encounter (Signed)
Patient called received a call from the office that she will need CBC checked before Accrufer 30 mg tablet can be refilled. Patient has annual exam scheduled with PCP on 10/14/22 asked if CBC could be done there and have labs sent. I told patient that is fine, we just need results before refill can be provided.   She also mentioned cycle started 4 days ago and its been heavy, wearing pad changing about every 2 hours, cramps. Reports last 3 cycles has been fine, no missed pills, takes on time. I told patient I would relay our conversation to you and if you have any recommendations get back with her.

## 2022-10-12 NOTE — Telephone Encounter (Signed)
Yes, results can just be sent here.  I would continue to monitor bleeding. If it remains heavy and does not slow down in a few days let us know. Ibuprofen 600-800 mg every 8 hours for pain and also can decrease bleeding by up to 40%.

## 2022-10-12 NOTE — Telephone Encounter (Signed)
Patient said she can't take Ibuprofen , she has history of stroke and takes injections for migraines. Patient said she was told no ibuprofen since. She takes tylenol and will continue, will follow up if it doesn't slow down.  Patient said thank you for your help!

## 2022-10-20 ENCOUNTER — Telehealth: Payer: Self-pay | Admitting: *Deleted

## 2022-10-20 NOTE — Telephone Encounter (Signed)
Yes, it is OK to stop taking the iron supplement. She should continue Micronor to help prevent recurrence of anemia due to heavy periods. Thanks.

## 2022-10-20 NOTE — Telephone Encounter (Signed)
Returned call to patient and advised of message as seen below from [place], NP. Patient verbalized understanding. States she would like for Tiffany to review all labs and advise if anything further. RN advised once received,Tiffany would sign off and patient would be updated. Patient agreeable.   Routing to provider and will close encounter.

## 2022-10-20 NOTE — Telephone Encounter (Signed)
Patient left voicemail on Triage line asking if results received from [facility] as patient requested. Unable to see a scanned copy in Epic today. Patient asking if she still needs to continue taking iron supplementation?  Labs with [facility] on 10-14-22 reviewed in [facility] and hemoglobin was 12.8. RN called patient and advised would review with Tiffany, NP and return call. Patient agreeable.   Routing to provider for review.

## 2022-11-09 ENCOUNTER — Telehealth: Payer: Self-pay | Admitting: *Deleted

## 2022-11-09 NOTE — Telephone Encounter (Signed)
Patient called left message in triage voicemail c/o bleeding on Micronor 0.35 mg tablet. Left message for patient to call.

## 2022-12-23 ENCOUNTER — Other Ambulatory Visit: Payer: Self-pay | Admitting: Nurse Practitioner

## 2022-12-23 DIAGNOSIS — N946 Dysmenorrhea, unspecified: Secondary | ICD-10-CM

## 2022-12-23 DIAGNOSIS — N920 Excessive and frequent menstruation with regular cycle: Secondary | ICD-10-CM

## 2022-12-24 NOTE — Telephone Encounter (Signed)
RF received for Norethindrone 0.35mg  #84.  Last AEX 03/29/22.  RF sent.

## 2023-03-04 ENCOUNTER — Other Ambulatory Visit: Payer: Self-pay

## 2023-03-04 DIAGNOSIS — N920 Excessive and frequent menstruation with regular cycle: Secondary | ICD-10-CM

## 2023-03-04 DIAGNOSIS — N946 Dysmenorrhea, unspecified: Secondary | ICD-10-CM

## 2023-03-04 MED ORDER — NORETHINDRONE 0.35 MG PO TABS: 1.0000 | ORAL_TABLET | Freq: Every day | ORAL | 0 refills | Status: DC

## 2023-03-04 NOTE — Telephone Encounter (Signed)
Last AEX 04/08/2022--scheduled 04/13/2023 Last mammo 08/07/2022-neg birads 1  Rx sent on 12/24/2022 for #84 w 1 refill to alternate pharmacy. Will contact pt to confirm that she wants a pharmacy change.   No answer, LVMTCB.  Rx pend.

## 2023-03-04 NOTE — Telephone Encounter (Signed)
Pt returned call stating that she does wish for remainder of refills be sent to mail order pharmacy. Will route to provider for authorization.

## 2023-04-13 ENCOUNTER — Ambulatory Visit: Payer: 59 | Admitting: Nurse Practitioner

## 2023-06-29 ENCOUNTER — Encounter: Payer: Self-pay | Admitting: Nurse Practitioner

## 2023-06-29 ENCOUNTER — Ambulatory Visit (INDEPENDENT_AMBULATORY_CARE_PROVIDER_SITE_OTHER): Payer: 59 | Admitting: Nurse Practitioner

## 2023-06-29 VITALS — BP 128/60 | HR 78 | Ht 62.0 in | Wt 169.0 lb

## 2023-06-29 DIAGNOSIS — N920 Excessive and frequent menstruation with regular cycle: Secondary | ICD-10-CM | POA: Diagnosis not present

## 2023-06-29 DIAGNOSIS — D500 Iron deficiency anemia secondary to blood loss (chronic): Secondary | ICD-10-CM

## 2023-06-29 DIAGNOSIS — N946 Dysmenorrhea, unspecified: Secondary | ICD-10-CM

## 2023-06-29 DIAGNOSIS — Z01419 Encounter for gynecological examination (general) (routine) without abnormal findings: Secondary | ICD-10-CM | POA: Diagnosis not present

## 2023-06-29 MED ORDER — NORETHINDRONE 0.35 MG PO TABS
1.0000 | ORAL_TABLET | Freq: Every day | ORAL | 3 refills | Status: DC
Start: 2023-06-29 — End: 2024-08-01

## 2023-06-29 NOTE — Progress Notes (Signed)
[name] [name] Oct 09, 1976 [id number]   History:  47 y.o. G2P1011 presents for annual exam. Monthly cycles. H/O anemia s/t heavy menses and dysmenorrhea. Well controlled on POPs but wants to discuss hysterectomy. Normal pap history. Migraine without aura managed by neurology. H/O TIA, anemia, HLD. Would like iron panel today. Not currently on iron supplement.   Gynecologic History Patient's last menstrual period was 06/06/2023. Period Cycle (Days): 28 Period Duration (Days): 6 Period Pattern: Regular Menstrual Flow: Moderate Menstrual Control: Maxi pad Menstrual Control Change Freq (Hours): 2 Dysmenorrhea: (!) Moderate Dysmenorrhea Symptoms: Cramping, Nausea, Diarrhea Contraception/Family planning: condoms and rhythm method Sexually active: Yes  Health Maintenance Last Pap: 12/30/2018. Results were: Normal neg HPV, 5-year repeat Last mammogram: 08/07/2022. Results were: Normal Last colonoscopy: Not indicated Last Dexa: Not indicated  Past medical history, past surgical history, family history and social history were all reviewed and documented in the EPIC chart. Long-term boyfriend. 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 32 yo son.   ROS:  A ROS was performed and pertinent positives and negatives are included.  Exam:  Vitals:   06/29/23 1444  BP: 128/60  Pulse: 78  SpO2: 100%  Weight: 169 lb (76.7 kg)  Height: 5\' 2"  (1.575 m)     Body mass index is 30.91 kg/m.  General appearance:  Normal Thyroid:  Symmetrical, normal in size, without palpable masses or nodularity. Respiratory  Auscultation:  Clear without wheezing or rhonchi Cardiovascular  Auscultation:  Regular rate, without rubs, murmurs or gallops  Edema/varicosities:  Not grossly evident Abdominal  Soft,nontender, without masses, guarding or rebound.  Liver/spleen:  No organomegaly noted  Hernia:  None appreciated  Skin  Inspection:  Grossly normal Breasts:  Examined lying and sitting.   Right: Without masses, retractions, nipple discharge or axillary adenopathy.   Left: Without masses, retractions, nipple discharge or axillary adenopathy. Gentitourinary   Inguinal/mons:  Normal without inguinal adenopathy  External genitalia:  Generalized redness with no masses, tenderness, or lesions  BUS/Urethra/Skene's glands:  Normal  Vagina:  Normal appearing with normal color and discharge, no lesions  Cervix:  Normal appearing without discharge or lesions  Uterus:  Normal in size, shape and contour.  Midline and mobile, nontender  Adnexa/parametria:     Rt: Normal in size, without masses or tenderness.   Lt: Normal in size, without masses or tenderness.  Anus and perineum: Normal  Patient informed chaperone available to be present for breast and pelvic exam. Patient has requested no chaperone to be present. Patient has been advised what will be completed during breast and pelvic exam.   Assessment/Plan:  47 y.o. G2P1011 for annual exam.   Well female exam with routine gynecological exam - Education provided on SBEs, importance of preventative screenings, current guidelines, high calcium diet, regular exercise, and multivitamin daily. Labs with PCP.   Iron deficiency anemia due to chronic blood loss - Plan: Iron, TIBC and Ferritin Panel. Not currently on supplement.   Menorrhagia with regular cycle - Plan: norethindrone (MICRONOR) 0.35 MG tablet daily. Requesting surgical consult to discuss hysterectomy d/t long history of anemia s/t menorrhagia and dysmenorrhea. Aware may not be candidate since well controlled on POPs. Does not want to continue taking hormonal contraception. Will schedule with Dr. Karma Greaser.   Dysmenorrhea - Plan: norethindrone (MICRONOR) 0.35 MG tablet daily.   Screening for cervical cancer - Normal Pap history.  Will repeat at 5-year interval per guidelines.  Screening for breast cancer - Normal  mammogram history. Continue annual  screenings. Normal breast exam today.  Screening for colon cancer - Discussed current guidelines and importance of preventative screenings. Plans to have colonoscopy.   Return in 1 year for annual.     [name] Mackie DNP, 4:07 PM 06/29/2023

## 2023-06-30 ENCOUNTER — Other Ambulatory Visit: Payer: Self-pay | Admitting: Nurse Practitioner

## 2023-06-30 DIAGNOSIS — R790 Abnormal level of blood mineral: Secondary | ICD-10-CM

## 2023-06-30 MED ORDER — ACCRUFER 30 MG PO CAPS: 1.0000 | ORAL_CAPSULE | Freq: Two times a day (BID) | ORAL | 2 refills | Status: DC

## 2023-08-04 ENCOUNTER — Ambulatory Visit: Payer: 59 | Admitting: Obstetrics and Gynecology

## 2023-08-25 ENCOUNTER — Ambulatory Visit: Payer: 59 | Admitting: Obstetrics and Gynecology

## 2023-09-02 ENCOUNTER — Encounter: Payer: Self-pay | Admitting: Obstetrics and Gynecology

## 2023-09-02 ENCOUNTER — Ambulatory Visit (INDEPENDENT_AMBULATORY_CARE_PROVIDER_SITE_OTHER): Payer: 59 | Admitting: Obstetrics and Gynecology

## 2023-09-02 VITALS — BP 110/70 | HR 82 | Wt 173.0 lb

## 2023-09-02 DIAGNOSIS — N920 Excessive and frequent menstruation with regular cycle: Secondary | ICD-10-CM | POA: Diagnosis not present

## 2023-09-02 DIAGNOSIS — Z1211 Encounter for screening for malignant neoplasm of colon: Secondary | ICD-10-CM

## 2023-09-02 DIAGNOSIS — Z01818 Encounter for other preprocedural examination: Secondary | ICD-10-CM

## 2023-09-02 DIAGNOSIS — D500 Iron deficiency anemia secondary to blood loss (chronic): Secondary | ICD-10-CM | POA: Diagnosis not present

## 2023-09-02 DIAGNOSIS — Z1231 Encounter for screening mammogram for malignant neoplasm of breast: Secondary | ICD-10-CM

## 2023-09-02 DIAGNOSIS — N946 Dysmenorrhea, unspecified: Secondary | ICD-10-CM

## 2023-09-02 NOTE — Progress Notes (Signed)
   Acute Office Visit  Subjective:    Patient ID: [name] [name], female    DOB: 10/16/1976, 46 y.o.   MRN: [id number]   HPI 47 y.o. presents today for No chief complaint on file. History:  47 y.o. G2P1011 presents for surgical consultation from Wyline Beady, NP.  Per her note: Monthly cycles. H/O anemia s/t heavy menses and dysmenorrhea. Well controlled on POPs but wants to discuss hysterectomy. Normal pap history. Migraine without aura managed by neurology. H/O TIA, anemia, HLD. Would like iron panel today. Not currently on iron supplement.   H/o LTCSx1  Today, patient reports her her periods have been horrible her entire life.  Was afraid of starting ocp's and had a stroke 2013.   Now reports her periods are heavier, and she is on daily iron for which her hg goes down off it.  Doesn't feel good a week before her cycle with n/v and headaches. Migraines are worse before as well.  Cramping is difficult to manage.  Feels normal only 1-2 weeks out of a month. Cannot take advil with gout medication takes several doses of tylenol a day for the cramping. Taking progesterone only pills with no improvement.  Bleeds 7 days a month.  Can go through 4-6 pads a day.  With 3 of those day very heavy.  Has large clots. Can leak through at night and has to wake up and change. She is frustrated with the bleeding, pain and fatigue and would like a better quality of life and would like the [facility].  She does not desire to conceive in the future and understands the procedure is permanent. 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    Review of Systems     Objective:    OBGyn Exam  There were no vitals taken for this visit. Wt Readings from Last 3 Encounters:  06/29/23 169 lb (76.7 kg)  07/14/22 161 lb (73 kg)  04/08/22 160 lb (72.6 kg)      Past Medical History:  Diagnosis Date   Asthma    Elevated cholesterol    GERD (gastroesophageal reflux disease)    Migraine    Scoliosis    TIA (transient  ischemic attack) 01/2012   migraine variant, no clot but vessel spasm    Past Surgical History:  Procedure Laterality Date   CESAREAN SECTION  1997   boy   ELBOW SURGERY       Patient informed chaperone available to be present for breast and/or pelvic exam. Patient has requested no chaperone to be present. Patient has been advised what will be completed during breast and pelvic exam.   Assessment & Plan:  There are no diagnoses linked to this encounter.     Counseled on all options.  She would like to have the [facility].  Counseled extensively on the procedure including but not limited to what to expect and risks and benefits.  Counseled on postop care and pelvic rest for 10 weeks after the surgery with restricted lifting for 6 weeks after.  Counseled on the benefits of the robotic procedure with faster return to daily activities, improved outcomes, and less risk for complications. She would like to have this scheduled. To have a TV US and EMB before the procedure.  Encouraged patient to also complete the colonoscopy and mammogram as well.  30 minutes spent on reviewing records, imaging,  and one on one patient time and counseling patient and documentation Dr. Judith Blonder

## 2023-09-16 ENCOUNTER — Other Ambulatory Visit (HOSPITAL_COMMUNITY)
Admission: RE | Admit: 2023-09-16 | Discharge: 2023-09-16 | Disposition: A | Discharge status: Home / Self Care | Payer: 59 | Source: Ambulatory Visit | Attending: Obstetrics and Gynecology | Admitting: Obstetrics and Gynecology

## 2023-09-16 ENCOUNTER — Ambulatory Visit (INDEPENDENT_AMBULATORY_CARE_PROVIDER_SITE_OTHER): Payer: 59 | Admitting: Obstetrics and Gynecology

## 2023-09-16 ENCOUNTER — Encounter: Payer: Self-pay | Admitting: Obstetrics and Gynecology

## 2023-09-16 VITALS — BP 108/66 | HR 92 | Wt 173.0 lb

## 2023-09-16 DIAGNOSIS — Z01812 Encounter for preprocedural laboratory examination: Secondary | ICD-10-CM | POA: Diagnosis not present

## 2023-09-16 DIAGNOSIS — N8003 Adenomyosis of the uterus: Secondary | ICD-10-CM | POA: Insufficient documentation

## 2023-09-16 DIAGNOSIS — N920 Excessive and frequent menstruation with regular cycle: Secondary | ICD-10-CM | POA: Insufficient documentation

## 2023-09-16 DIAGNOSIS — Z124 Encounter for screening for malignant neoplasm of cervix: Secondary | ICD-10-CM | POA: Insufficient documentation

## 2023-09-16 DIAGNOSIS — B3731 Acute candidiasis of vulva and vagina: Secondary | ICD-10-CM

## 2023-09-16 DIAGNOSIS — D500 Iron deficiency anemia secondary to blood loss (chronic): Secondary | ICD-10-CM | POA: Diagnosis present

## 2023-09-16 DIAGNOSIS — Z01818 Encounter for other preprocedural examination: Secondary | ICD-10-CM

## 2023-09-16 DIAGNOSIS — N946 Dysmenorrhea, unspecified: Secondary | ICD-10-CM | POA: Diagnosis present

## 2023-09-16 DIAGNOSIS — N898 Other specified noninflammatory disorders of vagina: Secondary | ICD-10-CM

## 2023-09-16 LAB — PREGNANCY, URINE: Preg Test, Ur: NEGATIVE

## 2023-09-16 MED ORDER — ESTRADIOL 0.1 MG/GM VA CREA: TOPICAL_CREAM | VAGINAL | 12 refills | Status: AC

## 2023-09-16 MED ORDER — FLUCONAZOLE 150 MG PO TABS
150.0000 mg | ORAL_TABLET | Freq: Once | ORAL | 0 refills | Status: AC
Start: 2023-09-16 — End: 2023-09-16

## 2023-09-16 NOTE — Progress Notes (Addendum)
Acute Office Visit  Subjective:    Patient ID: [name] [name], female    DOB: 1976-07-19, 47 y.o.   MRN: [id number]   HPI 47 y.o. presents today for Procedure (Endometrium biopsy//sh) History:  47 y.o. G2P1011 presents for surgical consultation from [name] Beady, NP.  Per her note: Monthly cycles. H/O anemia s/t heavy menses and dysmenorrhea. Well controlled on POPs but wants to discuss hysterectomy. Normal pap history. Migraine without aura managed by neurology. H/O TIA, anemia, HLD. Would like iron panel today. Not currently on iron supplement.   H/o LTCSx1  Today, patient reports her her periods have been horrible her entire life.  Was afraid of starting ocp's and had a stroke 2013.   Now reports her periods are heavier, and she is on daily iron for which her hg goes down off it.  Doesn't feel good a week before her cycle with n/v and headaches. Migraines are worse before as well.  Cramping is difficult to manage.  Feels normal only 1-2 weeks out of a month. Cannot take advil with gout medication takes several doses of tylenol a day for the cramping. Taking progesterone only pills with no improvement.  Bleeds 7 days a month.  Can go through 4-6 pads a day.  With 3 of those day very heavy.  Has large clots. Can leak through at night and has to wake up and change. She is frustrated with the bleeding, pain and fatigue and would like a better quality of life and would like the [facility].  She does not desire to conceive in the future and understands the procedure is permanent. 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  She is here today for preop EMB  Patient's last menstrual period was 08/28/2023 (exact date). Period Duration (Days): 6-7 Period Pattern: Regular Menstrual Flow: Heavy Menstrual Control: Maxi pad Dysmenorrhea: (!) Severe Dysmenorrhea Symptoms: Cramping, Nausea, Diarrhea, Headache  Review of Systems     Objective:    Physical Exam Genitourinary:     Urethral  meatus normal.     No lesions in the vagina.     Right Labia: rash.     Left Labia: rash.      No vaginal tenderness.     No cervical discharge.     Uterus is enlarged.     Wt 173 lb (78.5 kg)   LMP 08/28/2023 (Exact Date)   BMI 31.64 kg/m  Wt Readings from Last 3 Encounters:  09/16/23 173 lb (78.5 kg)  09/02/23 173 lb (78.5 kg)  06/29/23 169 lb (76.7 kg)      Past Medical History:  Diagnosis Date   Asthma    Elevated cholesterol    GERD (gastroesophageal reflux disease)    Migraine    Scoliosis    TIA (transient ischemic attack) 01/2012   migraine variant, no clot but vessel spasm    Past Surgical History:  Procedure Laterality Date   CESAREAN SECTION  1997   boy   ELBOW SURGERY       PROCEDURE: EMB Consent obtained for the procedure.  A bivalve speculum was placed in the vagina.  The cervix was grasped with a single tooth tenaculum.  Pipelle was inserted and rotated.  Uterus sound to 10cm.  Adequate specimen was obtained and sent to pathology.  All instruments were removed.  Patient tolerated the procedure well.  To notify patient of the results.  Patient informed chaperone available to be present for breast and/or pelvic exam. Patient  has requested no chaperone to be present. Patient has been advised what will be completed during breast and pelvic exam.   Menorrhagia, Anemia, dysmenorrhea, adenomyosis  Assessment & Plan:  Encounter for preprocedural laboratory examination -     Pregnancy, urine   Patient desires [facility].  To send to surgery scheduling and return for preopl  Dr. Karma Greaser  Diflucan sent for vulvar irritation.  May also apply estrogen cream.  Patient instructed to return if this persists and she may need a punch biopsy at that time.  [name] [name]

## 2023-09-16 NOTE — Addendum Note (Signed)
Addended by: Earley Favor on: 09/16/2023 02:35 PM   Modules accepted: Orders

## 2023-09-17 LAB — SURESWAB® ADVANCED VAGINITIS PLUS,TMA
C. trachomatis RNA, TMA: NOT DETECTED
CANDIDA SPECIES: NOT DETECTED
Candida glabrata: NOT DETECTED
N. gonorrhoeae RNA, TMA: NOT DETECTED
SURESWAB(R) ADV BACTERIAL VAGINOSIS(BV),TMA: NEGATIVE
TRICHOMONAS VAGINALIS (TV),TMA: NOT DETECTED

## 2023-09-20 LAB — SURGICAL PATHOLOGY

## 2023-09-20 LAB — CYTOLOGY - PAP
Adequacy: ABSENT
Comment: NEGATIVE
Diagnosis: NEGATIVE
High risk HPV: NEGATIVE

## 2023-10-12 ENCOUNTER — Other Ambulatory Visit: Payer: 59

## 2023-10-15 ENCOUNTER — Ambulatory Visit
Admission: RE | Admit: 2023-10-15 | Discharge: 2023-10-15 | Disposition: A | Discharge status: Home / Self Care | Payer: 59 | Source: Ambulatory Visit | Attending: Obstetrics and Gynecology

## 2023-10-15 DIAGNOSIS — Z1231 Encounter for screening mammogram for malignant neoplasm of breast: Secondary | ICD-10-CM

## 2023-10-20 ENCOUNTER — Other Ambulatory Visit: Payer: Self-pay | Admitting: Obstetrics and Gynecology

## 2023-10-20 DIAGNOSIS — R928 Other abnormal and inconclusive findings on diagnostic imaging of breast: Secondary | ICD-10-CM

## 2023-10-28 ENCOUNTER — Other Ambulatory Visit: Payer: Self-pay | Admitting: Obstetrics and Gynecology

## 2023-10-28 ENCOUNTER — Ambulatory Visit (INDEPENDENT_AMBULATORY_CARE_PROVIDER_SITE_OTHER): Payer: 59

## 2023-10-28 DIAGNOSIS — D500 Iron deficiency anemia secondary to blood loss (chronic): Secondary | ICD-10-CM

## 2023-10-28 DIAGNOSIS — Z1211 Encounter for screening for malignant neoplasm of colon: Secondary | ICD-10-CM

## 2023-10-28 DIAGNOSIS — N920 Excessive and frequent menstruation with regular cycle: Secondary | ICD-10-CM | POA: Diagnosis not present

## 2023-10-28 DIAGNOSIS — Z01818 Encounter for other preprocedural examination: Secondary | ICD-10-CM

## 2023-10-28 DIAGNOSIS — Z1231 Encounter for screening mammogram for malignant neoplasm of breast: Secondary | ICD-10-CM

## 2023-10-28 DIAGNOSIS — N946 Dysmenorrhea, unspecified: Secondary | ICD-10-CM

## 2023-11-03 ENCOUNTER — Telehealth: Payer: Self-pay | Admitting: *Deleted

## 2023-11-03 NOTE — Telephone Encounter (Signed)
Patient left message requesting PUS results dated 10/28/23.   Routing to Dr. Karma Greaser to review and advise.

## 2023-11-08 ENCOUNTER — Ambulatory Visit
Admission: RE | Admit: 2023-11-08 | Discharge: 2023-11-08 | Disposition: A | Discharge status: Home / Self Care | Payer: 59 | Source: Ambulatory Visit | Attending: Obstetrics and Gynecology | Admitting: Obstetrics and Gynecology

## 2023-11-08 ENCOUNTER — Ambulatory Visit
Admission: RE | Admit: 2023-11-08 | Discharge: 2023-11-08 | Disposition: A | Discharge status: Home / Self Care | Payer: 59 | Source: Ambulatory Visit | Attending: Obstetrics and Gynecology

## 2023-11-08 ENCOUNTER — Other Ambulatory Visit: Payer: Self-pay | Admitting: Obstetrics and Gynecology

## 2023-11-08 DIAGNOSIS — R928 Other abnormal and inconclusive findings on diagnostic imaging of breast: Secondary | ICD-10-CM

## 2023-11-08 DIAGNOSIS — N6489 Other specified disorders of breast: Secondary | ICD-10-CM

## 2023-11-10 ENCOUNTER — Ambulatory Visit
Admission: RE | Admit: 2023-11-10 | Discharge: 2023-11-10 | Disposition: A | Discharge status: Home / Self Care | Payer: 59 | Source: Ambulatory Visit | Attending: Obstetrics and Gynecology

## 2023-11-10 ENCOUNTER — Inpatient Hospital Stay
Admission: RE | Admit: 2023-11-10 | Discharge: 2023-11-10 | Discharge status: Home / Self Care | Payer: 59 | Source: Ambulatory Visit | Attending: Obstetrics and Gynecology | Admitting: Obstetrics and Gynecology

## 2023-11-10 DIAGNOSIS — N6489 Other specified disorders of breast: Secondary | ICD-10-CM

## 2023-11-10 HISTORY — PX: BREAST BIOPSY: SHX20

## 2023-11-11 LAB — SURGICAL PATHOLOGY

## 2023-11-15 ENCOUNTER — Encounter: Payer: Self-pay | Admitting: Obstetrics and Gynecology

## 2024-02-02 ENCOUNTER — Encounter: Payer: Self-pay | Admitting: Obstetrics and Gynecology

## 2024-02-02 DIAGNOSIS — N8003 Adenomyosis of the uterus: Secondary | ICD-10-CM

## 2024-02-02 DIAGNOSIS — N946 Dysmenorrhea, unspecified: Secondary | ICD-10-CM

## 2024-02-02 DIAGNOSIS — N920 Excessive and frequent menstruation with regular cycle: Secondary | ICD-10-CM

## 2024-02-02 DIAGNOSIS — D500 Iron deficiency anemia secondary to blood loss (chronic): Secondary | ICD-10-CM

## 2024-02-02 NOTE — Telephone Encounter (Signed)
 Previous surgery referral closed. Patient was going to call when ready to schedule.  AEX 06/29/23 -TW  Spoke with patient, would like to proceed with surgery end of May or June. Reviewed dates, will proceed with 04/28/24. Advised patient will need new referral from Dr. Karma Greaser, once this has been completed and benefits reviewed, will proceed with scheduling pre-op and surgery. Patient agreeable.   Dr. Karma Greaser -please place new surgery referral.

## 2024-02-02 NOTE — Telephone Encounter (Signed)
 She is ready to schedule now. Will need the referral now for benefits.

## 2024-02-11 ENCOUNTER — Other Ambulatory Visit: Payer: Self-pay | Admitting: Nurse Practitioner

## 2024-02-11 DIAGNOSIS — R790 Abnormal level of blood mineral: Secondary | ICD-10-CM

## 2024-02-14 ENCOUNTER — Other Ambulatory Visit: Payer: Self-pay | Admitting: Nurse Practitioner

## 2024-02-14 DIAGNOSIS — D509 Iron deficiency anemia, unspecified: Secondary | ICD-10-CM

## 2024-02-14 NOTE — Telephone Encounter (Signed)
 Please call to schedule lab appointment. Refill for Accrufer denied until then.

## 2024-02-14 NOTE — Telephone Encounter (Signed)
 Med refill request: Accrufer Last AEX: 06/29/2023 Next AEX: 07/03/2024 Last MMG (if hormonal med) 11/08/2023 Refill authorized: Last Rx sent #[id number] with 2 refills on 06/30/2023. Please approve or deny as appropriate.

## 2024-02-21 ENCOUNTER — Other Ambulatory Visit

## 2024-02-22 ENCOUNTER — Other Ambulatory Visit

## 2024-02-22 DIAGNOSIS — D509 Iron deficiency anemia, unspecified: Secondary | ICD-10-CM

## 2024-02-22 NOTE — Telephone Encounter (Signed)
 Dr. Karma Greaser, please place surgery referral for scheduling.

## 2024-02-23 ENCOUNTER — Encounter: Payer: Self-pay | Admitting: Nurse Practitioner

## 2024-02-23 LAB — CBC WITH DIFFERENTIAL/PLATELET
Absolute Lymphocytes: 2332 {cells}/uL (ref 850–3900)
Absolute Monocytes: 543 {cells}/uL (ref 200–950)
Basophils Absolute: 45 {cells}/uL (ref 0–200)
Basophils Relative: 0.5 %
Eosinophils Absolute: 71 {cells}/uL (ref 15–500)
Eosinophils Relative: 0.8 %
HCT: 38.6 % (ref 35.0–45.0)
Hemoglobin: 12.6 g/dL (ref 11.7–15.5)
MCH: 26.9 pg — ABNORMAL LOW (ref 27.0–33.0)
MCHC: 32.6 g/dL (ref 32.0–36.0)
MCV: 82.5 fL (ref 80.0–100.0)
MPV: 12.2 fL (ref 7.5–12.5)
Monocytes Relative: 6.1 %
Neutro Abs: 5910 {cells}/uL (ref 1500–7800)
Neutrophils Relative %: 66.4 %
Platelets: 245 10*3/uL (ref 140–400)
RBC: 4.68 10*6/uL (ref 3.80–5.10)
RDW: 12.6 % (ref 11.0–15.0)
Total Lymphocyte: 26.2 %
WBC: 8.9 10*3/uL (ref 3.8–10.8)

## 2024-02-23 LAB — IRON,TIBC AND FERRITIN PANEL
%SAT: 26 % (ref 16–45)
Ferritin: 33 ng/mL (ref 16–232)
Iron: 88 ug/dL (ref 40–190)
TIBC: 339 ug/dL (ref 250–450)

## 2024-03-07 NOTE — Telephone Encounter (Signed)
 Dr. Tia Flowers - I will need a new surgery to get patient scheduled for 04/28/24 as requested by patient. Please let me know when this has been completed.

## 2024-03-14 NOTE — Telephone Encounter (Signed)
 See surgery referral.   Encounter closed.

## 2024-06-23 ENCOUNTER — Ambulatory Visit (HOSPITAL_COMMUNITY): Admit: 2024-06-23 | Admitting: Obstetrics and Gynecology

## 2024-06-23 SURGERY — HYSTERECTOMY, TOTAL, LAPAROSCOPIC, ROBOT-ASSISTED WITH SALPINGECTOMY
Anesthesia: General

## 2024-07-03 ENCOUNTER — Ambulatory Visit: Payer: 59 | Admitting: Nurse Practitioner

## 2024-08-01 ENCOUNTER — Telehealth: Payer: Self-pay | Admitting: *Deleted

## 2024-08-01 ENCOUNTER — Other Ambulatory Visit: Payer: Self-pay | Admitting: Nurse Practitioner

## 2024-08-01 DIAGNOSIS — N920 Excessive and frequent menstruation with regular cycle: Secondary | ICD-10-CM

## 2024-08-01 DIAGNOSIS — D500 Iron deficiency anemia secondary to blood loss (chronic): Secondary | ICD-10-CM

## 2024-08-01 DIAGNOSIS — D509 Iron deficiency anemia, unspecified: Secondary | ICD-10-CM

## 2024-08-01 DIAGNOSIS — D219 Benign neoplasm of connective and other soft tissue, unspecified: Secondary | ICD-10-CM

## 2024-08-01 DIAGNOSIS — N946 Dysmenorrhea, unspecified: Secondary | ICD-10-CM

## 2024-08-01 DIAGNOSIS — N8003 Adenomyosis of the uterus: Secondary | ICD-10-CM

## 2024-08-01 NOTE — Telephone Encounter (Signed)
 Patient left message on triage line stating she may want to proceed with surgery scheduling prior to moving to [facility].   Call returned to patient, left detailed message, ok per dpr. Advised OV recommended with Dr. Glennon, last OV 09/16/23. Return call to office at [phone], opt 1 for appts, opt 4 if any additional questions.   Last AEX 06/29/23-TW Last OV 09/16/23  Routing to Dr. Glennon to review.

## 2024-08-01 NOTE — Telephone Encounter (Signed)
 Med refill request: [employer]  0.35 mg tablet - #84 with 3 refills  Last AEX: 06/29/23 Next AEX: 09/05/24 Last MMG (if hormonal med): 11/08/2023 Refill authorized? Please Advise

## 2024-08-02 NOTE — Telephone Encounter (Signed)
 Spoke with patient, reviewed surgery dates, patient request to proceed with surgery on 11/03/24. AEX scheduled with TW for 09/05/24. Pre-op scheduled with Dr. Glennon for 10/10/24.    Dr. Glennon -previous surgery referral closed. Patient will need new referral. Please let me know once this has been entered and I will get her scheduled.

## 2024-08-02 NOTE — Telephone Encounter (Signed)
 Call placed to patient, left detailed message, ok per dpr. Advised per Dr. Glennon, return call to [facility], RN at [facility] [phone], opt 5.

## 2024-09-05 ENCOUNTER — Ambulatory Visit: Admitting: Nurse Practitioner

## 2024-09-05 ENCOUNTER — Encounter: Payer: Self-pay | Admitting: Nurse Practitioner

## 2024-09-05 VITALS — BP 110/70 | HR 88 | Ht 61.75 in | Wt 184.0 lb

## 2024-09-05 DIAGNOSIS — N946 Dysmenorrhea, unspecified: Secondary | ICD-10-CM | POA: Diagnosis not present

## 2024-09-05 DIAGNOSIS — N920 Excessive and frequent menstruation with regular cycle: Secondary | ICD-10-CM

## 2024-09-05 DIAGNOSIS — Z1331 Encounter for screening for depression: Secondary | ICD-10-CM | POA: Diagnosis not present

## 2024-09-05 DIAGNOSIS — D500 Iron deficiency anemia secondary to blood loss (chronic): Secondary | ICD-10-CM

## 2024-09-05 DIAGNOSIS — Z01419 Encounter for gynecological examination (general) (routine) without abnormal findings: Secondary | ICD-10-CM | POA: Diagnosis not present

## 2024-09-05 MED ORDER — NORETHINDRONE 0.35 MG PO TABS: 1.0000 | ORAL_TABLET | Freq: Every day | ORAL | 0 refills | Status: DC

## 2024-09-05 NOTE — Progress Notes (Signed)
 [name] [name] 1976-03-09 [id number]   History:  48 y.o. G2P1011 presents for annual exam. Monthly cycles. H/O anemia s/t heavy menses and dysmenorrhea. 08/2023 benign EMB, pap normal neg HPV. POPs. Hysterectomy scheduled in December. Normal pap history. Migraine without aura managed by neurology. H/O TIA, anemia, HLD. Benign left breast biopsy 10/2023.  Gynecologic History Patient's last menstrual period was 08/19/2024 (exact date). Period Duration (Days): 7 Period Pattern: Regular Menstrual Flow: Heavy Menstrual Control: Maxi pad Dysmenorrhea: (!) Severe Dysmenorrhea Symptoms: Cramping, Nausea, Diarrhea, Headache Contraception/Family planning: condoms and rhythm method Sexually active: Yes  Health Maintenance Last Pap: 09/16/2023. Results were: Normal neg HPV Last mammogram: 10/15/2023. Results were: Possible left breast distortion, biopsy -  FIBROCYSTIC CHANGES WITH FOCAL USUAL DUCTAL HYPERPLASIA AND APOCRINE METAPLASIA.       FOCAL PSEUDOANGIOMATOUS STROMAL HYPERPLASIA (PASH).       NEGATIVE FOR MALIGNANCY.  Last colonoscopy: Never Last Dexa: Not indicated     09/05/2024    3:47 PM  Depression screen PHQ 2/9  Decreased Interest 0  Down, Depressed, Hopeless 0  PHQ - 2 Score 0     Past medical history, past surgical history, family history and social history were all reviewed and documented in the EPIC chart. Long-term boyfriend. 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 1 yo son, moved to [place] this year. She plans to move there early next year.   ROS:  A ROS was performed and pertinent positives and negatives are included.  Exam:  Vitals:   09/05/24 1538  BP: 110/70  Pulse: 88  SpO2: 97%  Weight: 184 lb (83.5 kg)  Height: 5' 1.75 (1.568 m)      Body mass index is 33.93 kg/m.  General appearance:  Normal Thyroid:  Symmetrical, normal in size, without palpable masses or nodularity. Respiratory  Auscultation:  Clear  without wheezing or rhonchi Cardiovascular  Auscultation:  Regular rate, without rubs, murmurs or gallops  Edema/varicosities:  Not grossly evident Abdominal  Soft,nontender, without masses, guarding or rebound.  Liver/spleen:  No organomegaly noted  Hernia:  None appreciated  Skin  Inspection:  Grossly normal Breasts: Examined lying and sitting.   Right: Without masses, retractions, nipple discharge or axillary adenopathy.   Left: Without masses, retractions, nipple discharge or axillary adenopathy. Pelvic: External genitalia:  no lesions              Urethra:  normal appearing urethra with no masses, tenderness or lesions              Bartholins and Skenes: normal                 Vagina: normal appearing vagina with normal color and discharge, no lesions              Cervix: no lesions Bimanual Exam:  Uterus:  no masses or tenderness              Adnexa: no mass, fullness, tenderness              Rectovaginal: Deferred              Anus:  normal, no lesions  [name] [name], CMA present as chaperone.   Assessment/Plan:  48 y.o. G2P1011 for annual exam.   Well female exam with routine gynecological exam - Education provided on SBEs, importance of preventative screenings, current guidelines, high calcium diet, regular exercise, and multivitamin daily. Labs with PCP.   Iron deficiency anemia  due to chronic blood loss - Plan: CBC with Differential/Platelet, Iron, TIBC and Ferritin Panel. Not currently taking iron supplement.   Menorrhagia with regular cycle - Plan: norethindrone  (MICRONOR ) 0.35 MG tablet daily. Hysterectomy scheduled in December.   Dysmenorrhea - Plan: norethindrone  (MICRONOR ) 0.35 MG tablet daily.   Screening for cervical cancer - Normal Pap history.   Screening for breast cancer - Benign left breast biopsy 10/2023. Continue annual screenings. Normal breast exam today.  Screening for colon cancer - Discussed current guidelines and importance of preventative  screenings. Plans to have colonoscopy + endoscopy after hysterectomy.   Return in about 1 year (around 09/05/2025) for Annual.      [name] DELENA Shutter DNP, 4:07 PM 09/05/2024

## 2024-09-06 ENCOUNTER — Ambulatory Visit: Payer: Self-pay | Admitting: Nurse Practitioner

## 2024-09-06 LAB — CBC WITH DIFFERENTIAL/PLATELET
Absolute Lymphocytes: 2678 {cells}/uL (ref 850–3900)
Absolute Monocytes: 662 {cells}/uL (ref 200–950)
Basophils Absolute: 48 {cells}/uL (ref 0–200)
Basophils Relative: 0.5 %
Eosinophils Absolute: 96 {cells}/uL (ref 15–500)
Eosinophils Relative: 1 %
HCT: 40.5 % (ref 35.0–45.0)
Hemoglobin: 13.4 g/dL (ref 11.7–15.5)
MCH: 27.1 pg (ref 27.0–33.0)
MCHC: 33.1 g/dL (ref 32.0–36.0)
MCV: 81.8 fL (ref 80.0–100.0)
MPV: 12.3 fL (ref 7.5–12.5)
Monocytes Relative: 6.9 %
Neutro Abs: 6115 {cells}/uL (ref 1500–7800)
Neutrophils Relative %: 63.7 %
Platelets: 260 Thousand/uL (ref 140–400)
RBC: 4.95 Million/uL (ref 3.80–5.10)
RDW: 12.6 % (ref 11.0–15.0)
Total Lymphocyte: 27.9 %
WBC: 9.6 Thousand/uL (ref 3.8–10.8)

## 2024-09-06 LAB — IRON,TIBC AND FERRITIN PANEL
%SAT: 24 % (ref 16–45)
Ferritin: 18 ng/mL (ref 16–232)
Iron: 97 ug/dL (ref 40–190)
TIBC: 397 ug/dL (ref 250–450)

## 2024-10-09 ENCOUNTER — Encounter: Payer: Self-pay | Admitting: Obstetrics and Gynecology

## 2024-10-09 ENCOUNTER — Ambulatory Visit: Admitting: Obstetrics and Gynecology

## 2024-10-09 VITALS — BP 118/74 | HR 93 | Ht 61.22 in | Wt 185.0 lb

## 2024-10-09 DIAGNOSIS — N946 Dysmenorrhea, unspecified: Secondary | ICD-10-CM | POA: Diagnosis not present

## 2024-10-09 DIAGNOSIS — Z01818 Encounter for other preprocedural examination: Secondary | ICD-10-CM

## 2024-10-09 DIAGNOSIS — D500 Iron deficiency anemia secondary to blood loss (chronic): Secondary | ICD-10-CM

## 2024-10-09 DIAGNOSIS — N8003 Adenomyosis of the uterus: Secondary | ICD-10-CM

## 2024-10-09 DIAGNOSIS — N920 Excessive and frequent menstruation with regular cycle: Secondary | ICD-10-CM

## 2024-10-09 DIAGNOSIS — D219 Benign neoplasm of connective and other soft tissue, unspecified: Secondary | ICD-10-CM

## 2024-10-09 MED ORDER — OXYCODONE HCL 5 MG PO TABS: 5.0000 mg | ORAL_TABLET | ORAL | 0 refills | Status: AC | PRN

## 2024-10-09 MED ORDER — IBUPROFEN 800 MG PO TABS: 800.0000 mg | ORAL_TABLET | Freq: Three times a day (TID) | ORAL | 1 refills | Status: AC | PRN

## 2024-10-09 MED ORDER — METOCLOPRAMIDE HCL 10 MG PO TABS: 10.0000 mg | ORAL_TABLET | Freq: Three times a day (TID) | ORAL | 0 refills | Status: AC | PRN

## 2024-10-09 NOTE — Progress Notes (Signed)
 48 y.o. y.o. female here for preop RLH, bilateral salpingectomy, cystoscopy  49 y.o. G2P1011 presents for surgical consultation from Annabella Shutter, NP.  Per her note: Monthly cycles. H/O anemia s/t heavy menses and dysmenorrhea. Well controlled on POPs but wants to discuss hysterectomy. Normal pap history. Migraine without aura managed by neurology. H/O TIA, anemia, HLD. Recent iron panel in normal range.  H/o LTCSx1  Today, patient reports her her periods have been horrible her entire life.  Was afraid of starting ocp's and had a stroke 2013.   Now reports her periods are heavier, and she is on daily iron for which her hg goes down off it.  Doesn't feel good a week before her cycle with n/v and headaches. Migraines are worse before as well.  Cramping is difficult to manage.  Feels normal only 1-2 weeks out of a month. Cannot take advil with gout medication takes several doses of tylenol  a day for the cramping. Taking progesterone  only pills with no improvement.  Bleeds 7 days a month.  Can go through 4-6 pads a day.  With 3 of those day very heavy.  Has large clots. Can leak through at night and has to wake up and change. She is frustrated with the bleeding, pain and fatigue and would like a better quality of life and would like the [facility].  She does not desire to conceive in the future and understands the procedure is permanent. 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 Period Duration (Days): 7 Menstrual Flow: Heavy Menstrual Control: Maxi pad Dysmenorrhea: (!) Severe Dysmenorrhea Symptoms: Cramping, Diarrhea, Nausea, Headache  G2P1011 presents for annual exam. Monthly cycles. H/O anemia s/t heavy menses and dysmenorrhea. 08/2023 benign EMB, pap normal neg HPV. POPs. Hysterectomy scheduled in December. Normal pap history. Migraine without aura managed by neurology. H/O TIA, anemia, HLD. Benign left breast biopsy 10/2023.  Gynecologic  History Patient's last menstrual period was 08/19/2024 (exact date). Period Duration (Days): 7 Period Pattern: Regular Menstrual Flow: Heavy Menstrual Control: Maxi pad Dysmenorrhea: (!) Severe Dysmenorrhea Symptoms: Cramping, Nausea, Diarrhea, Headache Contraception/Family planning: condoms and rhythm method Sexually active: Yes  Health Maintenance Last Pap: 09/16/2023. Results were: Normal neg HPV Last mammogram: 10/15/2023. Results were: Possible left breast distortion, biopsy -  FIBROCYSTIC CHANGES WITH FOCAL USUAL DUCTAL HYPERPLASIA AND APOCRINE METAPLASIA.       FOCAL PSEUDOANGIOMATOUS STROMAL HYPERPLASIA (PASH).       NEGATIVE FOR MALIGNANCY.  Last colonoscopy: Never Last Dexa: Not indicated Body mass index is 34.7 kg/m.    Blood pressure 118/74, pulse 93, height 5' 1.22 (1.555 m), weight 185 lb (83.9 kg), last menstrual period 09/16/2024, SpO2 98%.     Component Value Date/Time   DIAGPAP  09/16/2023 1433    - Negative for intraepithelial lesion or malignancy (NILM)   HPVHIGH Negative 09/16/2023 1433   ADEQPAP  09/16/2023 1433    Satisfactory for evaluation; transformation zone component ABSENT.    GYN HISTORY:    Component Value Date/Time   DIAGPAP  09/16/2023 1433    - Negative for intraepithelial lesion or malignancy (NILM)   HPVHIGH Negative 09/16/2023 1433   ADEQPAP  09/16/2023 1433    Satisfactory for evaluation; transformation zone component ABSENT.    OB History  Gravida Para Term Preterm AB Living  2 1 1  1 1   SAB IAB Ectopic Multiple Live Births   1   1    # Outcome Date GA Lbr Len/2nd Weight  Sex Type Anes PTL Lv  2 IAB           1 Term     M CS-Unspec  N LIV    Past Medical History:  Diagnosis Date   Asthma    Elevated cholesterol    GERD (gastroesophageal reflux disease)    Migraine    Scoliosis    TIA (transient ischemic attack) 01/2012   migraine variant, no clot but vessel spasm    Past Surgical History:  Procedure Laterality  Date   BREAST BIOPSY Left 11/10/2023   MM LT BREAST BX W LOC DEV 1ST LESION IMAGE BX SPEC STEREO GUIDE 11/10/2023 [facility]   CESAREAN SECTION  1997   boy   ELBOW SURGERY      Current Outpatient Medications on File Prior to Visit  Medication Sig Dispense Refill   ADVAIR DISKUS 250-50 MCG/DOSE AEPB Inhale 1 puff into the lungs 2 (two) times daily.     Ascorbic Acid (VITAMIN C PO) Take by mouth.     aspirin  81 MG chewable tablet Chew 81 mg by mouth daily.     atorvastatin (LIPITOR) 20 MG tablet Take 20 mg by mouth daily.     buPROPion (WELLBUTRIN XL) 150 MG 24 hr tablet 150 mg daily.  1   CRANBERRY PO Take by mouth.     DENTA 5000 PLUS 1.1 % CREA dental cream Take by mouth 2 (two) times daily.     diclofenac (VOLTAREN) 50 MG EC tablet TAKE 1 TABLET BY MOUTH TWICE A DAY WITH FOOD FOR HEADACHE Oral; Duration: 20     DULoxetine (CYMBALTA) 60 MG capsule Take 60 mg by mouth daily.  1   estradiol  (ESTRACE  VAGINAL) 0.1 MG/GM vaginal cream Rub pea size amount each night for 3 weeks then 3 times a week thereafter. 42.5 g 12   EVENING PRIMROSE OIL PO Take by mouth.     Ferric Maltol  (ACCRUFER ) 30 MG CAPS Take 1 capsule (30 mg total) by mouth in the morning and at bedtime. Take without food (1 hour before eating or 2 hours after eating) 60 capsule 2   levalbuterol  (XOPENEX  HFA) 45 MCG/ACT inhaler Inhale 2 puffs into the lungs every 4 (four) hours as needed. For asthma.     mometasone (NASONEX) 50 MCG/ACT nasal spray Place 2 sprays into the nose daily.     montelukast (SINGULAIR) 10 MG tablet Take 10 mg by mouth at bedtime.     Multiple Vitamin (MULITIVITAMIN WITH MINERALS) TABS Take 1 tablet by mouth daily.     naproxen  (NAPROSYN ) 500 MG tablet Take 1 tablet (500 mg total) by mouth 2 (two) times daily with a meal. 30 tablet 3   norethindrone  (INCASSIA ) 0.35 MG tablet Take 1 tablet (0.35 mg total) by mouth daily. 84 tablet 0   omeprazole (PRILOSEC) 20 MG capsule Take 20 mg by mouth daily.      ondansetron  (ZOFRAN ) 4 MG tablet Take 1 tablet (4 mg total) by mouth every 8 (eight) hours as needed for nausea or vomiting. 40 tablet 0   promethazine (PHENERGAN) 25 MG tablet 1 TABLET UP TO 3 TIMES A DAY AS NEEDED FOR NAUSEA ORALLY 30 DAY(S)  1   rizatriptan  (MAXALT -MLT) 10 MG disintegrating tablet Take 1 tablet (10 mg total) by mouth as needed for migraine. May repeat in 2 hours if needed 9 tablet 11   tretinoin (RETIN-A) 0.025 % cream   5   No current facility-administered medications on file prior to visit.  Social History   Socioeconomic History   Marital status: Divorced    Spouse name: Not on file   Number of children: 1   Years of education: some college   Highest education level: Not on file  Occupational History    Comment: [employer], [employer]  Tobacco Use   Smoking status: Former    Current packs/day: 0.00    Types: Cigarettes    Start date: 12/13/1995    Quit date: 12/12/2005    Years since quitting: 18.8   Smokeless tobacco: Never  Vaping Use   Vaping status: Never Used  Substance and Sexual Activity   Alcohol use: Yes    Comment: 2-3 x month   Drug use: No   Sexual activity: Yes    Partners: Male    Birth control/protection: Rhythm, OCP    Comment: 1st intercourse 48 yo-More than 5 partners  Other Topics Concern   Not on file  Social History Narrative   Lives alone   Caffeine- quit 01/2012   Social Drivers of Health   Financial Resource Strain: Not on file  Food Insecurity: Not on file  Transportation Needs: Not on file  Physical Activity: Not on file  Stress: Not on file  Social Connections: Not on file  Intimate Partner Violence: Not on file    Family History  Problem Relation Age of Onset   Diabetes Father    Hypertension Father    Hyperlipidemia Father    Cancer Father        prostate   Heart attack Father        x 2   Cancer Maternal Uncle        STOMACH   Diabetes Maternal Uncle    Cancer Maternal Grandfather        LUNG   Cancer  Paternal Grandmother        stomach   Cancer Paternal Grandfather        lung     Allergies  Allergen Reactions   Lexapro [Escitalopram Oxalate] Other (See Comments)    Can't remember, rash?   Topamax  [Topiramate ]     Continued weight loss, drowsiness      Patient's last menstrual period was Patient's last menstrual period was 09/16/2024 (exact date)..           Review of Systems Alls systems reviewed and are negative.     Physical Exam Constitutional:      Appearance: Normal appearance.  Musculoskeletal:     Cervical back: Normal range of motion.  Neurological:     Mental Status: She is alert and oriented to person, place, and time.  Psychiatric:        Mood and Affect: Mood normal.        Behavior: Behavior normal.  Vitals and nursing note reviewed.       A:         PREOP H&P for RLH, bilateral salpingectomy, cystoscopy Menorrhagia Anemia H/o LTCS x1 Dysmenorrhea Failed hormonal management               P:        The risks of surgery were discussed in detail with the patient including but not limited to: bleeding which may require transfusion or reoperation; infection which may require prolonged hospitalization or re-hospitalization and antibiotic therapy; injury to bowel, bladder, ureters and major vessels or other surrounding organs which may lead to other procedures; formation of adhesions; need for additional procedures including laparotomy or subsequent procedures secondary to  intraoperative injury or abnormal pathology; thromboembolic phenomenon; incisional problems and other postoperative or anesthesia complications.  The postoperative expectations were also discussed in detail. The patient also understands the alternative treatment options which were discussed in full. All questions were answered.  Patient would like to proceed with the procedure.  30 minutes spent on reviewing records, imaging,  and one on one patient time and counseling patient and  documentation Dr. Glennon  No follow-ups on file.  Almarie MARLA Glennon

## 2024-10-09 NOTE — H&P (View-Only) (Signed)
 48 y.o. y.o. female here for preop RLH, bilateral salpingectomy, cystoscopy  49 y.o. G2P1011 presents for surgical consultation from Annabella Shutter, NP.  Per her note: Monthly cycles. H/O anemia s/t heavy menses and dysmenorrhea. Well controlled on POPs but wants to discuss hysterectomy. Normal pap history. Migraine without aura managed by neurology. H/O TIA, anemia, HLD. Recent iron panel in normal range.  H/o LTCSx1  Today, patient reports her her periods have been horrible her entire life.  Was afraid of starting ocp's and had a stroke 2013.   Now reports her periods are heavier, and she is on daily iron for which her hg goes down off it.  Doesn't feel good a week before her cycle with n/v and headaches. Migraines are worse before as well.  Cramping is difficult to manage.  Feels normal only 1-2 weeks out of a month. Cannot take advil with gout medication takes several doses of tylenol  a day for the cramping. Taking progesterone  only pills with no improvement.  Bleeds 7 days a month.  Can go through 4-6 pads a day.  With 3 of those day very heavy.  Has large clots. Can leak through at night and has to wake up and change. She is frustrated with the bleeding, pain and fatigue and would like a better quality of life and would like the [facility].  She does not desire to conceive in the future and understands the procedure is permanent. 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 Period Duration (Days): 7 Menstrual Flow: Heavy Menstrual Control: Maxi pad Dysmenorrhea: (!) Severe Dysmenorrhea Symptoms: Cramping, Diarrhea, Nausea, Headache  G2P1011 presents for annual exam. Monthly cycles. H/O anemia s/t heavy menses and dysmenorrhea. 08/2023 benign EMB, pap normal neg HPV. POPs. Hysterectomy scheduled in December. Normal pap history. Migraine without aura managed by neurology. H/O TIA, anemia, HLD. Benign left breast biopsy 10/2023.  Gynecologic  History Patient's last menstrual period was 08/19/2024 (exact date). Period Duration (Days): 7 Period Pattern: Regular Menstrual Flow: Heavy Menstrual Control: Maxi pad Dysmenorrhea: (!) Severe Dysmenorrhea Symptoms: Cramping, Nausea, Diarrhea, Headache Contraception/Family planning: condoms and rhythm method Sexually active: Yes  Health Maintenance Last Pap: 09/16/2023. Results were: Normal neg HPV Last mammogram: 10/15/2023. Results were: Possible left breast distortion, biopsy -  FIBROCYSTIC CHANGES WITH FOCAL USUAL DUCTAL HYPERPLASIA AND APOCRINE METAPLASIA.       FOCAL PSEUDOANGIOMATOUS STROMAL HYPERPLASIA (PASH).       NEGATIVE FOR MALIGNANCY.  Last colonoscopy: Never Last Dexa: Not indicated Body mass index is 34.7 kg/m.    Blood pressure 118/74, pulse 93, height 5' 1.22 (1.555 m), weight 185 lb (83.9 kg), last menstrual period 09/16/2024, SpO2 98%.     Component Value Date/Time   DIAGPAP  09/16/2023 1433    - Negative for intraepithelial lesion or malignancy (NILM)   HPVHIGH Negative 09/16/2023 1433   ADEQPAP  09/16/2023 1433    Satisfactory for evaluation; transformation zone component ABSENT.    GYN HISTORY:    Component Value Date/Time   DIAGPAP  09/16/2023 1433    - Negative for intraepithelial lesion or malignancy (NILM)   HPVHIGH Negative 09/16/2023 1433   ADEQPAP  09/16/2023 1433    Satisfactory for evaluation; transformation zone component ABSENT.    OB History  Gravida Para Term Preterm AB Living  2 1 1  1 1   SAB IAB Ectopic Multiple Live Births   1   1    # Outcome Date GA Lbr Len/2nd Weight  Sex Type Anes PTL Lv  2 IAB           1 Term     M CS-Unspec  N LIV    Past Medical History:  Diagnosis Date   Asthma    Elevated cholesterol    GERD (gastroesophageal reflux disease)    Migraine    Scoliosis    TIA (transient ischemic attack) 01/2012   migraine variant, no clot but vessel spasm    Past Surgical History:  Procedure Laterality  Date   BREAST BIOPSY Left 11/10/2023   MM LT BREAST BX W LOC DEV 1ST LESION IMAGE BX SPEC STEREO GUIDE 11/10/2023 [facility]   CESAREAN SECTION  1997   boy   ELBOW SURGERY      Current Outpatient Medications on File Prior to Visit  Medication Sig Dispense Refill   ADVAIR DISKUS 250-50 MCG/DOSE AEPB Inhale 1 puff into the lungs 2 (two) times daily.     Ascorbic Acid (VITAMIN C PO) Take by mouth.     aspirin  81 MG chewable tablet Chew 81 mg by mouth daily.     atorvastatin (LIPITOR) 20 MG tablet Take 20 mg by mouth daily.     buPROPion (WELLBUTRIN XL) 150 MG 24 hr tablet 150 mg daily.  1   CRANBERRY PO Take by mouth.     DENTA 5000 PLUS 1.1 % CREA dental cream Take by mouth 2 (two) times daily.     diclofenac (VOLTAREN) 50 MG EC tablet TAKE 1 TABLET BY MOUTH TWICE A DAY WITH FOOD FOR HEADACHE Oral; Duration: 20     DULoxetine (CYMBALTA) 60 MG capsule Take 60 mg by mouth daily.  1   estradiol  (ESTRACE  VAGINAL) 0.1 MG/GM vaginal cream Rub pea size amount each night for 3 weeks then 3 times a week thereafter. 42.5 g 12   EVENING PRIMROSE OIL PO Take by mouth.     Ferric Maltol  (ACCRUFER ) 30 MG CAPS Take 1 capsule (30 mg total) by mouth in the morning and at bedtime. Take without food (1 hour before eating or 2 hours after eating) 60 capsule 2   levalbuterol  (XOPENEX  HFA) 45 MCG/ACT inhaler Inhale 2 puffs into the lungs every 4 (four) hours as needed. For asthma.     mometasone (NASONEX) 50 MCG/ACT nasal spray Place 2 sprays into the nose daily.     montelukast (SINGULAIR) 10 MG tablet Take 10 mg by mouth at bedtime.     Multiple Vitamin (MULITIVITAMIN WITH MINERALS) TABS Take 1 tablet by mouth daily.     naproxen  (NAPROSYN ) 500 MG tablet Take 1 tablet (500 mg total) by mouth 2 (two) times daily with a meal. 30 tablet 3   norethindrone  (INCASSIA ) 0.35 MG tablet Take 1 tablet (0.35 mg total) by mouth daily. 84 tablet 0   omeprazole (PRILOSEC) 20 MG capsule Take 20 mg by mouth daily.      ondansetron  (ZOFRAN ) 4 MG tablet Take 1 tablet (4 mg total) by mouth every 8 (eight) hours as needed for nausea or vomiting. 40 tablet 0   promethazine (PHENERGAN) 25 MG tablet 1 TABLET UP TO 3 TIMES A DAY AS NEEDED FOR NAUSEA ORALLY 30 DAY(S)  1   rizatriptan  (MAXALT -MLT) 10 MG disintegrating tablet Take 1 tablet (10 mg total) by mouth as needed for migraine. May repeat in 2 hours if needed 9 tablet 11   tretinoin (RETIN-A) 0.025 % cream   5   No current facility-administered medications on file prior to visit.  Social History   Socioeconomic History   Marital status: Divorced    Spouse name: Not on file   Number of children: 1   Years of education: some college   Highest education level: Not on file  Occupational History    Comment: [employer], [employer]  Tobacco Use   Smoking status: Former    Current packs/day: 0.00    Types: Cigarettes    Start date: 12/13/1995    Quit date: 12/12/2005    Years since quitting: 18.8   Smokeless tobacco: Never  Vaping Use   Vaping status: Never Used  Substance and Sexual Activity   Alcohol use: Yes    Comment: 2-3 x month   Drug use: No   Sexual activity: Yes    Partners: Male    Birth control/protection: Rhythm, OCP    Comment: 1st intercourse 48 yo-More than 5 partners  Other Topics Concern   Not on file  Social History Narrative   Lives alone   Caffeine- quit 01/2012   Social Drivers of Health   Financial Resource Strain: Not on file  Food Insecurity: Not on file  Transportation Needs: Not on file  Physical Activity: Not on file  Stress: Not on file  Social Connections: Not on file  Intimate Partner Violence: Not on file    Family History  Problem Relation Age of Onset   Diabetes Father    Hypertension Father    Hyperlipidemia Father    Cancer Father        prostate   Heart attack Father        x 2   Cancer Maternal Uncle        STOMACH   Diabetes Maternal Uncle    Cancer Maternal Grandfather        LUNG   Cancer  Paternal Grandmother        stomach   Cancer Paternal Grandfather        lung     Allergies  Allergen Reactions   Lexapro [Escitalopram Oxalate] Other (See Comments)    Can't remember, rash?   Topamax  [Topiramate ]     Continued weight loss, drowsiness      Patient's last menstrual period was Patient's last menstrual period was 09/16/2024 (exact date)..           Review of Systems Alls systems reviewed and are negative.     Physical Exam Constitutional:      Appearance: Normal appearance.  Musculoskeletal:     Cervical back: Normal range of motion.  Neurological:     Mental Status: She is alert and oriented to person, place, and time.  Psychiatric:        Mood and Affect: Mood normal.        Behavior: Behavior normal.  Vitals and nursing note reviewed.       A:         PREOP H&P for RLH, bilateral salpingectomy, cystoscopy Menorrhagia Anemia H/o LTCS x1 Dysmenorrhea Failed hormonal management               P:        The risks of surgery were discussed in detail with the patient including but not limited to: bleeding which may require transfusion or reoperation; infection which may require prolonged hospitalization or re-hospitalization and antibiotic therapy; injury to bowel, bladder, ureters and major vessels or other surrounding organs which may lead to other procedures; formation of adhesions; need for additional procedures including laparotomy or subsequent procedures secondary to  intraoperative injury or abnormal pathology; thromboembolic phenomenon; incisional problems and other postoperative or anesthesia complications.  The postoperative expectations were also discussed in detail. The patient also understands the alternative treatment options which were discussed in full. All questions were answered.  Patient would like to proceed with the procedure.  30 minutes spent on reviewing records, imaging,  and one on one patient time and counseling patient and  documentation Dr. Glennon  No follow-ups on file.  [name] [name] MARLA Glennon

## 2024-10-09 NOTE — Patient Instructions (Signed)
 Robotic Laparoscopic Hysterectomy, Care After  The following information offers guidance on how to care for yourself after your procedure. Your health care provider may also give you more specific instructions. If you have problems or questions, contact your health care provider. What can I expect after the procedure? After the procedure, it is common to have: Pain, bruising, and numbness around your incisions. Tiredness (fatigue).  Abdominal bloating Poor appetite. Chest discomfort that radiates to your shoulder from the carbon dioxide gas for a few days after  Vaginal discharge or spotting. You will need to use a sanitary pad after this procedure.  HEAVY BLEEDING LIKE A PERIOD IS NOT NORMAL.  PLEASE CALL YOUR PROVIDER IF SOAKING A PAD or have copious discharge and or pain.  Feelings of sadness or other emotions.  If your ovaries were also removed, it is also common to have symptoms of menopause, such as hot flashes, night sweats, and lack of sleep (insomnia).  Ovaries should stay in if at all possible until at least the age of 62. Follow these instructions at home: Medicines Take over-the-counter and prescription medicines only as told by your health care provider. Ask your health care provider if the medicine prescribed to you: Requires you to avoid driving or using machinery. You cannot drive for 24 hours after anesthesia Can cause constipation. You may need to take these actions to prevent or treat constipation: Drink enough fluid to keep your urine pale yellow. Take over-the-counter or prescription medicines. Eat foods that are high in fiber, such as beans, whole grains, and fresh fruits and vegetables. Limit foods that are high in fat and processed sugars, such as fried or sweet foods.  Also, avoid spicy foods.  NAUSEA IS COMMON THE FIRST NIGHT OF SURGERY.  IF IT LASTS BEYOND 24 HOURS, CALL YOUR PROVIDER.  NAUSEA MEDICATION WAS GIVEN AT YOUR PREOP APPOINTMENT THAT YOU CAN TAKE  AFTER SURGERY. Incision care  Follow instructions from your health care provider about how to take care of your incisions. Make sure you: LEAVE INCISION OPEN AND DRY-NO BANDAGES Leave stitches (sutures), skin glue, or adhesive strips in place UNTIL 2 WEEKS THEN REMOVE IN THE SHOWER.  If adhesive strip edges start to loosen and curl up, you may trim the loose edges. Check your incision areas every day for signs of infection. Check for: More redness, swelling, or pain. Fluid or blood. Warmth. Pus or a bad smell. Activity  Rest as told by your health care provider. Avoid sitting for a long time without moving. Get up to take short walks every 1-2 hours. This is important to improve blood flow and breathing. Ask for help if you feel weak or unsteady.  If you are sore or tired, rest. Return to your normal activities as told by your health care provider. Ask your health care provider what activities are safe for you. Do not lift, push or pull anything that is heavier than 13 lb (4.5 kg), or the limit that you are told, for 6 WEEKS after surgery or until your health care provider says that it is safe. If you were given a sedative during the procedure, it can affect you for several hours. Do not drive or operate machinery until your health care provider says that it is safe. Lifestyle Do not use any products that contain nicotine or tobacco. These products include cigarettes, chewing tobacco, and vaping devices, such as e-cigarettes. These can delay healing after surgery. If you need help quitting, ask your health care provider.  Do not drink alcohol until your health care provider approves. Take a daily multivitamin and keep a high protein diet for wound healing  DO NOT HAVE INTERCOURSE UNTIL YOU ARE INSTRUCTED THAT IT IS SAFE TO DO SO  Post operative appointments need to be scheduled at 2, 6 and 10 weeks.  You can come anytime before these with any concerns.   Discussed and reviewed with patient  risks with early intercourse or use of any foreign objects (externally or internally) can increase your risk including but not limited to the risk of vaginal cuff separation and or infection, risks for bowel involvement, risk for emergent surgery, and hospital admission with need for antibiotics.  Discussed in cases with cuff separation and bowel involvement there may be the need for colostomy placement as well.  In no situation should she have intercourse unless cleared to do so.  This can be anywhere from 10 weeks or longer after surgery.  General instructions YOU MAY TAKE SHOWERS ONLY FOR 2 WEEKS AFTER SURGERY, THEN YOU MAY USE TUBS AND HOT TUBS OR SWIM AFTER THAT Do not douche, use tampons, or have sex for at least 10 weeks, or possibly longer. You will need to have an exam done in the office to be cleared to have intercourse. If you struggle with physical or emotional changes after your procedure, speak with your health care provider or a therapist.  IF YOU HAVE BURNING WITH URINATION, PLEASE CALL YOUR DOCTOR. BLADDER INFECTIONS MAY OCCUR AFTER SURGERY Try to have someone at home with you for the first week to help with your daily chores.  Most patients are driving by the end of the first week after the robotic hysterectomy. Wear compression stockings as told by your health care provider. These stockings help to prevent blood clots and reduce swelling in your legs. Keep all follow-up visits. This is important. Contact a health care provider if: You have any of these signs of infection: Chills or a fever 148f OR GREATER. More redness, swelling, or pain around an incision. Fluid or blood coming from an incision. Warmth coming from an incision. Pus or a bad smell coming from an incision. Burning with urination. Urinary frequency or cramping.   IF YOU HAVE THESE SYMPTOMS, PLEASE CALL THE OFFICE TO COME EVALUATE FOR A BLADDER INFECTION AT [phone] An incision opens. You feel dizzy or  light-headed. You have pain or bleeding when you urinate, or you are unable to urinate. You have abnormal vaginal discharge. You have pain that does not get better with medicine. Get help right away if: You have a fever and your symptoms suddenly get worse. You have severe abdominal pain. Heavy vaginal bleeding, like a period You have chest pain or shortness of breath. You may have chest pain and shortness of breath from the CO2 gas for a few days after surgery.  This is very common.  Walking, Gas-X and motrin  will usually help relieve this discomfort You faint. You have pain, swelling, or redness in your leg.  These symptoms may represent a serious problem that is an emergency. Do not wait to see if the symptoms will go away. Get medical help right away. Call your local emergency services (911 in the [place] Do not drive yourself to the hospital. Summary  CONSTIPATION MEDICATION AFTER SURGERY: COLACE, MOM, MIRALAX , GAS X are all helpful to have on hand, if needed.  FILL ALL POSTOP MEDICATION BEFORE SURGERY   [email or web address] is a nice blog site for women preparing for the  robotic hysterectomy

## 2024-10-10 ENCOUNTER — Encounter: Admitting: Obstetrics and Gynecology

## 2024-10-16 ENCOUNTER — Encounter: Payer: Self-pay | Admitting: *Deleted

## 2024-10-27 ENCOUNTER — Encounter (HOSPITAL_COMMUNITY): Payer: Self-pay | Admitting: Obstetrics and Gynecology

## 2024-10-27 NOTE — Progress Notes (Signed)
 Surgical Instructions  Your procedure is scheduled on :  Friday December 12th, 2025 Report to [facility] Main Entrance A at 5:30 AM, then check in the Admitting office. Any questions or running late day of surgery :  call [phone]  Questions prior to your surgery day:  call [phone], Monday -- Friday 8am - 4pm. If you experience any cold or flu symptoms such as cough, fever, chills, shortness of breath, etc. between now and you scheduled surgery, please notify your surgeon office.   Remember: Do Not eat any food after midnight the night before surgery.   This includes No water,  candy,  gum, and mints.  Take these medicines the morning of surgery with A SIPS OF WATER:  -Advair, Wellbutrin, Cymbalta and Prilosec.   May take these medicines IF NEEDED:     One week prior to surgery, STOP taking any Aspirin  (unless otherwise instructed by your surgeon) Aleve , Naproxen , ibuprofen , Motrin , Advil , Goody's, BC's, all herbal medications/ supplements, fish oil, and non-prescription vitamins.  Do NOT Smoke (tobacco/ vaping) and Do Not drink alcohol for 24 hours prior to your procedure.  For those patients that use a CPAP.  Please bring your CPAP/ mask/ tubing with them day of surgery . Anesthesia may ask recovery room nurse to use and if you stay the night you be asked to use it.  You will be asked to removed any contacts, glasses, piercing's, hearing aid's, dentures/ partials prior to surgery.  Please bring cases/ container/ solution/ etc., for them day of surgery.   Patients discharged the day of surgery will NOT be allowed to drive home.  You must have responsible driver and caregiver to stay at home with you the next 24 hours.  SURGICAL WAITING ROOM VISITATION Patients may have no more than 2 support people in the waiting area - if more than 2 , these visitors may rotate.  Pre-op nurse will coordinate an appropriate time for 1 Adult support person, who may not rotate, to accompany  patient in pre-op.  Aware some patients may have certain circumstances, speak to pre-op nurse day of surgery.  Children under the age 57 must have an adult with them who is not the patient and must remain in the main waiting area with an adult.  If the patient needs to stay at the hospital during part of their recovery, the visitor guidelines for inpatient rooms apply.  Please refer to the [facility] website for the visitor guidelines for any additional information.  If you received a COVID test during your pre-op visit it is requested that you wear a mask when out in public, stay away from anyone that may not be feeling well and notify your surgeon if you develop symptoms.  If you have been in contact with anyone that has tested positive in the past 10 days notify your surgeon.     [place] - Preparing for Surgery  Before surgery, you can play an important role. Because skin is not sterile, it needs to be as free of germs as possible. You can reduce the number of germs on your skin by washing with CHG (chlorhexidine gluconate) soap before surgery. CHG is an antiseptic cleaner which kills germs and bonds with the skin to continue killing germs even after washing. Oral hygiene is also important in reducing the risk of infection. Remember to brush your teeth with your regular toothpaste the morning of surgery.  Please DO NOT use if you have an allergy to CHG or  antibacterial soaps. If your skin becomes reddened/irritated stop using the CHG and inform your Pre-op nurse day of surgery.  DO NOT shave (including legs and genital area) for at least 48 hours prior to your CHG shower.   Please follow these instructions carefully:  Shower with CHG soap the night before surgery. If you choose to wash your hair, wash your hair first as usual with your normal shampoo. After you shampoo, rinse your hair and body thoroughly to remove the shampoo. Use CHG as you would any other liquid soap. You can apply  CHG directly to the skin and wash gently with a clean washcloth or shower sponge. Apply the CHG soap to your body ONLY FROM THE NECK DOWN. Do not use on open wounds or open sores. Avoid contact with your eyes, ears, mouth, and genitals (private parts). Wash genitals (private parts) with your normal soap. Wash thoroughly, paying special attention to the area where your surgery will be performed. Thoroughly rinse your body with warm water from the neck down. DO NOT shower/wash with your normal soap after using and rinsing off the CHG soap. DO NOT use lotions, oils, etc., after showering with CHG. Pat yourself dry with a clean towel. Wear clean pajamas. Place clean sheets on your bed the night of your CHG shower and do not sleep with pets.  Day of Surgery  DO NOT Apply any lotions,  powder,  oils,  deodorants (may use underarm deodorant),  cologne/  perfumes  or makeup Do Not wear jewelry /  piercing's/  metal/  permanent jewelry must be removed prior to arrival day of surgery. (No plastic piercing) Do Not wear nail polish,  gel polish,  artificial nails, or any other type of covering on natural finger nails (toe nails are okay) Remember to brush your teeth and rinse mouth out. Put on clean / comfortable clothes. [facility] is not responsible for valuables/ personal belongings

## 2024-10-27 NOTE — Progress Notes (Signed)
 Spoke w/ via phone for pre-op interview--- [name] needs dos---- CBC and T&S per protocol. Lab appt date 11/01/24 at 9AM. UPT per anesthesia.        Lab results------ COVID test -----patient states asymptomatic no test needed Arrive at -------0530 NPO after MN NO Solid Food.   Pre-Surgery Ensure or G2:  Med rec completed Medications to take morning of surgery ------Advair, Wellbutrin, Cymbalta and Prilosec. Diabetic medication ----   GLP1 agonist last dose: GLP1 instructions:  Patient instructed no nail polish to be worn day of surgery Patient instructed to bring photo id and insurance card day of surgery Patient aware to have Driver (ride ) / caregiver    for 24 hours after surgery - Son [name] [name] Patient Special Instructions ----- Pre-Op special Instructions -----  Patient verbalized understanding of instructions that were given at this phone interview. Patient denies chest pain, sob, fever, cough at the interview.

## 2024-11-01 ENCOUNTER — Inpatient Hospital Stay (HOSPITAL_COMMUNITY): Admission: RE | Admit: 2024-11-01 | Discharge: 2024-11-01 | Attending: Obstetrics and Gynecology

## 2024-11-01 ENCOUNTER — Ambulatory Visit: Payer: Self-pay | Admitting: Obstetrics and Gynecology

## 2024-11-01 DIAGNOSIS — Z01812 Encounter for preprocedural laboratory examination: Secondary | ICD-10-CM | POA: Insufficient documentation

## 2024-11-01 DIAGNOSIS — Z01818 Encounter for other preprocedural examination: Secondary | ICD-10-CM

## 2024-11-01 LAB — TYPE AND SCREEN
ABO/RH(D): O POS
Antibody Screen: NEGATIVE

## 2024-11-01 LAB — CBC
HCT: 41.6 % (ref 36.0–46.0)
Hemoglobin: 13.3 g/dL (ref 12.0–15.0)
MCH: 26.9 pg (ref 26.0–34.0)
MCHC: 32 g/dL (ref 30.0–36.0)
MCV: 84.2 fL (ref 80.0–100.0)
Platelets: 233 K/uL (ref 150–400)
RBC: 4.94 MIL/uL (ref 3.87–5.11)
RDW: 12.7 % (ref 11.5–15.5)
WBC: 7.6 K/uL (ref 4.0–10.5)
nRBC: 0 % (ref 0.0–0.2)

## 2024-11-02 DIAGNOSIS — Z0289 Encounter for other administrative examinations: Secondary | ICD-10-CM

## 2024-11-02 MED ORDER — SODIUM CHLORIDE 0.9 % IV SOLN
INTRAVENOUS | Status: DC
  Filled 2024-11-02: qty 10

## 2024-11-02 NOTE — Anesthesia Preprocedure Evaluation (Signed)
 Anesthesia Evaluation  Patient identified by MRN, date of birth, ID band Patient awake    Reviewed: Allergy & Precautions, NPO status , Patient's Chart, lab work & pertinent test results  Airway Mallampati: II  TM Distance: >3 FB Neck ROM: Full    Dental  (+) Teeth Intact, Dental Advisory Given, Missing,    Pulmonary asthma , former smoker   Pulmonary exam normal breath sounds clear to auscultation       Cardiovascular negative cardio ROS Normal cardiovascular exam Rhythm:Regular Rate:Normal     Neuro/Psych  Headaches TIA negative psych ROS   GI/Hepatic Neg liver ROS,GERD  Medicated,,  Endo/Other  Obesity   Renal/GU negative Renal ROS     Musculoskeletal negative musculoskeletal ROS (+)    Abdominal   Peds  Hematology negative hematology ROS (+)   Anesthesia Other Findings   Reproductive/Obstetrics Dysmenorrhea                              Anesthesia Physical Anesthesia Plan  ASA: 2  Anesthesia Plan: General   Post-op Pain Management: Tylenol  PO (pre-op)*   Induction: Intravenous  PONV Risk Score and Plan: 4 or greater and Midazolam, Dexamethasone, Ondansetron  and Scopolamine patch - Pre-op  Airway Management Planned: Oral ETT  Additional Equipment:   Intra-op Plan:   Post-operative Plan: Extubation in OR  Informed Consent: I have reviewed the patients History and Physical, chart, labs and discussed the procedure including the risks, benefits and alternatives for the proposed anesthesia with the patient or authorized representative who has indicated his/her understanding and acceptance.     Dental advisory given  Plan Discussed with: CRNA  Anesthesia Plan Comments:          Anesthesia Quick Evaluation

## 2024-11-03 ENCOUNTER — Encounter (HOSPITAL_COMMUNITY): Payer: Self-pay | Admitting: Obstetrics and Gynecology

## 2024-11-03 ENCOUNTER — Ambulatory Visit (HOSPITAL_COMMUNITY): Admitting: Anesthesiology

## 2024-11-03 ENCOUNTER — Encounter (HOSPITAL_COMMUNITY)
Admission: RE | Disposition: A | Discharge status: Home / Self Care | Payer: Self-pay | Source: Home / Self Care | Attending: Obstetrics and Gynecology

## 2024-11-03 ENCOUNTER — Ambulatory Visit (HOSPITAL_COMMUNITY)
Admission: RE | Admit: 2024-11-03 | Discharge: 2024-11-03 | Disposition: A | Attending: Obstetrics and Gynecology | Admitting: Obstetrics and Gynecology

## 2024-11-03 DIAGNOSIS — D259 Leiomyoma of uterus, unspecified: Secondary | ICD-10-CM | POA: Diagnosis not present

## 2024-11-03 DIAGNOSIS — Z01818 Encounter for other preprocedural examination: Secondary | ICD-10-CM

## 2024-11-03 DIAGNOSIS — N8003 Adenomyosis of the uterus: Secondary | ICD-10-CM

## 2024-11-03 DIAGNOSIS — Z793 Long term (current) use of hormonal contraceptives: Secondary | ICD-10-CM | POA: Insufficient documentation

## 2024-11-03 DIAGNOSIS — E785 Hyperlipidemia, unspecified: Secondary | ICD-10-CM | POA: Insufficient documentation

## 2024-11-03 DIAGNOSIS — Z6834 Body mass index (BMI) 34.0-34.9, adult: Secondary | ICD-10-CM | POA: Insufficient documentation

## 2024-11-03 DIAGNOSIS — K219 Gastro-esophageal reflux disease without esophagitis: Secondary | ICD-10-CM | POA: Insufficient documentation

## 2024-11-03 DIAGNOSIS — Z87891 Personal history of nicotine dependence: Secondary | ICD-10-CM | POA: Insufficient documentation

## 2024-11-03 DIAGNOSIS — N920 Excessive and frequent menstruation with regular cycle: Secondary | ICD-10-CM | POA: Diagnosis not present

## 2024-11-03 DIAGNOSIS — D219 Benign neoplasm of connective and other soft tissue, unspecified: Secondary | ICD-10-CM

## 2024-11-03 DIAGNOSIS — E669 Obesity, unspecified: Secondary | ICD-10-CM | POA: Insufficient documentation

## 2024-11-03 DIAGNOSIS — D500 Iron deficiency anemia secondary to blood loss (chronic): Secondary | ICD-10-CM

## 2024-11-03 DIAGNOSIS — N946 Dysmenorrhea, unspecified: Secondary | ICD-10-CM | POA: Diagnosis not present

## 2024-11-03 DIAGNOSIS — Z8673 Personal history of transient ischemic attack (TIA), and cerebral infarction without residual deficits: Secondary | ICD-10-CM | POA: Insufficient documentation

## 2024-11-03 DIAGNOSIS — J45909 Unspecified asthma, uncomplicated: Secondary | ICD-10-CM | POA: Insufficient documentation

## 2024-11-03 DIAGNOSIS — G43909 Migraine, unspecified, not intractable, without status migrainosus: Secondary | ICD-10-CM | POA: Insufficient documentation

## 2024-11-03 HISTORY — PX: ROBOTIC ASSISTED LAPAROSCOPIC LYSIS OF ADHESION: SHX6080

## 2024-11-03 HISTORY — PX: CYSTOSCOPY: SHX5120

## 2024-11-03 HISTORY — PX: HYSTERECTOMY, TOTAL, LAPAROSCOPIC, ROBOT-ASSISTED WITH SALPINGECTOMY: SHX7587

## 2024-11-03 LAB — POCT PREGNANCY, URINE: Preg Test, Ur: NEGATIVE

## 2024-11-03 LAB — ABO/RH: ABO/RH(D): O POS

## 2024-11-03 SURGERY — HYSTERECTOMY, TOTAL, LAPAROSCOPIC, ROBOT-ASSISTED WITH SALPINGECTOMY
Anesthesia: General | Site: Pelvis

## 2024-11-03 MED ORDER — LIDOCAINE 2% (20 MG/ML) 5 ML SYRINGE
INTRAMUSCULAR | Status: AC
  Filled 2024-11-03: qty 5

## 2024-11-03 MED ORDER — CHLORHEXIDINE GLUCONATE 0.12 % MT SOLN
15.0000 mL | Freq: Once | OROMUCOSAL | Status: AC
  Administered 2024-11-03: 15 mL via OROMUCOSAL

## 2024-11-03 MED ORDER — POVIDONE-IODINE 10 % EX SWAB: 2.0000 | Freq: Once | CUTANEOUS | Status: DC

## 2024-11-03 MED ORDER — DEXAMETHASONE SOD PHOSPHATE PF 10 MG/ML IJ SOLN
INTRAMUSCULAR | Status: DC | PRN
  Administered 2024-11-03: 10 mg via INTRAVENOUS

## 2024-11-03 MED ORDER — SCOPOLAMINE 1 MG/3DAYS TD PT72
1.0000 | MEDICATED_PATCH | TRANSDERMAL | Status: DC
  Administered 2024-11-03: 1 mg via TRANSDERMAL

## 2024-11-03 MED ORDER — ORAL CARE MOUTH RINSE: 15.0000 mL | Freq: Once | OROMUCOSAL | Status: AC

## 2024-11-03 MED ORDER — ONDANSETRON HCL 4 MG/2ML IJ SOLN
INTRAMUSCULAR | Status: AC
  Filled 2024-11-03: qty 2

## 2024-11-03 MED ORDER — BUPIVACAINE HCL (PF) 0.5 % IJ SOLN
INTRAMUSCULAR | Status: AC
  Filled 2024-11-03: qty 120

## 2024-11-03 MED ORDER — LIDOCAINE HCL 2 % IJ SOLN
INTRAMUSCULAR | Status: AC
  Filled 2024-11-03: qty 20

## 2024-11-03 MED ORDER — LIDOCAINE 2% (20 MG/ML) 5 ML SYRINGE
INTRAMUSCULAR | Status: DC | PRN
  Administered 2024-11-03: 100 mg via INTRAVENOUS

## 2024-11-03 MED ORDER — PHENYLEPHRINE HCL-NACL 20-0.9 MG/250ML-% IV SOLN
INTRAVENOUS | Status: DC | PRN
  Administered 2024-11-03: 20 ug/min via INTRAVENOUS

## 2024-11-03 MED ORDER — 0.9 % SODIUM CHLORIDE (POUR BTL) OPTIME
TOPICAL | Status: DC | PRN
  Administered 2024-11-03: 1000 mL

## 2024-11-03 MED ORDER — SUGAMMADEX SODIUM 200 MG/2ML IV SOLN
INTRAVENOUS | Status: DC | PRN
  Administered 2024-11-03: 200 mg via INTRAVENOUS

## 2024-11-03 MED ORDER — SODIUM CHLORIDE 0.9 % IV SOLN
INTRAVENOUS | Status: AC
  Filled 2024-11-03: qty 2

## 2024-11-03 MED ORDER — METHYLENE BLUE 20 MG/2ML IV SOSY
PREFILLED_SYRINGE | INTRAVENOUS | Status: AC
  Filled 2024-11-03: qty 4

## 2024-11-03 MED ORDER — MIDAZOLAM HCL 2 MG/2ML IJ SOLN
INTRAMUSCULAR | Status: AC
  Filled 2024-11-03: qty 2

## 2024-11-03 MED ORDER — PHENYLEPHRINE 80 MCG/ML (10ML) SYRINGE FOR IV PUSH (FOR BLOOD PRESSURE SUPPORT)
PREFILLED_SYRINGE | INTRAVENOUS | Status: AC
  Filled 2024-11-03: qty 10

## 2024-11-03 MED ORDER — OXYCODONE HCL 5 MG PO TABS
5.0000 mg | ORAL_TABLET | Freq: Once | ORAL | Status: AC
  Administered 2024-11-03: 5 mg via ORAL

## 2024-11-03 MED ORDER — ACETAMINOPHEN 500 MG PO TABS
ORAL_TABLET | ORAL | Status: AC
  Filled 2024-11-03: qty 2

## 2024-11-03 MED ORDER — ONDANSETRON HCL 4 MG/2ML IJ SOLN
INTRAMUSCULAR | Status: DC | PRN
  Administered 2024-11-03: 4 mg via INTRAVENOUS

## 2024-11-03 MED ORDER — SCOPOLAMINE 1 MG/3DAYS TD PT72
MEDICATED_PATCH | TRANSDERMAL | Status: AC
  Filled 2024-11-03: qty 1

## 2024-11-03 MED ORDER — CHLORHEXIDINE GLUCONATE 0.12 % MT SOLN
OROMUCOSAL | Status: AC
  Filled 2024-11-03: qty 15

## 2024-11-03 MED ORDER — LACTATED RINGERS IV SOLN: INTRAVENOUS | Status: DC

## 2024-11-03 MED ORDER — ROCURONIUM BROMIDE 10 MG/ML (PF) SYRINGE
PREFILLED_SYRINGE | INTRAVENOUS | Status: DC | PRN
  Administered 2024-11-03: 60 mg via INTRAVENOUS

## 2024-11-03 MED ORDER — MONSELS FERRIC SUBSULFATE EX SOLN
CUTANEOUS | Status: AC
  Filled 2024-11-03: qty 16

## 2024-11-03 MED ORDER — ONDANSETRON HCL 4 MG/2ML IJ SOLN: 4.0000 mg | Freq: Once | INTRAMUSCULAR | Status: DC | PRN

## 2024-11-03 MED ORDER — FENTANYL CITRATE (PF) 250 MCG/5ML IJ SOLN
INTRAMUSCULAR | Status: AC
  Filled 2024-11-03: qty 5

## 2024-11-03 MED ORDER — METHYLENE BLUE 20 MG/2ML IV SOSY
PREFILLED_SYRINGE | INTRAVENOUS | Status: DC | PRN
  Administered 2024-11-03: 10 mg via INTRAVENOUS

## 2024-11-03 MED ORDER — LACTATED RINGERS IV SOLN
INTRAVENOUS | Status: DC
  Administered 2024-11-03: 1000 mL via INTRAVENOUS

## 2024-11-03 MED ORDER — DEXMEDETOMIDINE HCL IN NACL 80 MCG/20ML IV SOLN
INTRAVENOUS | Status: DC | PRN
  Administered 2024-11-03 (×2): 10 ug via INTRAVENOUS

## 2024-11-03 MED ORDER — PROPOFOL 10 MG/ML IV BOLUS
INTRAVENOUS | Status: AC
  Filled 2024-11-03: qty 20

## 2024-11-03 MED ORDER — BUPIVACAINE HCL (PF) 0.5 % IJ SOLN
INTRAMUSCULAR | Status: DC | PRN
  Administered 2024-11-03: 18 mL

## 2024-11-03 MED ORDER — OXYCODONE HCL 5 MG PO TABS
ORAL_TABLET | ORAL | Status: AC
  Filled 2024-11-03: qty 1

## 2024-11-03 MED ORDER — PROPOFOL 10 MG/ML IV BOLUS
INTRAVENOUS | Status: DC | PRN
  Administered 2024-11-03: 160 mg via INTRAVENOUS

## 2024-11-03 MED ORDER — SODIUM CHLORIDE 0.9 % IV SOLN
2.0000 g | INTRAVENOUS | Status: AC
  Administered 2024-11-03: 2 g via INTRAVENOUS
  Filled 2024-11-03: qty 2

## 2024-11-03 MED ORDER — ROCURONIUM BROMIDE 10 MG/ML (PF) SYRINGE
PREFILLED_SYRINGE | INTRAVENOUS | Status: AC
  Filled 2024-11-03: qty 10

## 2024-11-03 MED ORDER — SODIUM CHLORIDE 0.9 % IV SOLN
INTRAVENOUS | Status: DC | PRN
  Administered 2024-11-03: 1000 mL

## 2024-11-03 MED ORDER — FENTANYL CITRATE (PF) 250 MCG/5ML IJ SOLN
INTRAMUSCULAR | Status: DC | PRN
  Administered 2024-11-03 (×2): 50 ug via INTRAVENOUS
  Administered 2024-11-03: 100 ug via INTRAVENOUS
  Administered 2024-11-03: 50 ug via INTRAVENOUS

## 2024-11-03 MED ORDER — MIDAZOLAM HCL (PF) 2 MG/2ML IJ SOLN
INTRAMUSCULAR | Status: DC | PRN
  Administered 2024-11-03: 2 mg via INTRAVENOUS

## 2024-11-03 MED ORDER — ACETAMINOPHEN 500 MG PO TABS
1000.0000 mg | ORAL_TABLET | ORAL | Status: AC
  Administered 2024-11-03: 1000 mg via ORAL

## 2024-11-03 MED ORDER — SUGAMMADEX SODIUM 200 MG/2ML IV SOLN
INTRAVENOUS | Status: AC
  Filled 2024-11-03: qty 2

## 2024-11-03 MED ORDER — FENTANYL CITRATE (PF) 100 MCG/2ML IJ SOLN: 25.0000 ug | INTRAMUSCULAR | Status: DC | PRN

## 2024-11-03 MED ORDER — AMISULPRIDE (ANTIEMETIC) 5 MG/2ML IV SOLN
10.0000 mg | Freq: Once | INTRAVENOUS | Status: AC | PRN
  Administered 2024-11-03: 10 mg via INTRAVENOUS

## 2024-11-03 MED ADMIN — henylephrine-NaCl Pref Syr 0.8 MG/10ML-0.9% (80 MCG/ML): 80 ug | INTRAVENOUS | NDC 65302050510

## 2024-11-03 MED FILL — Amisulpride (Antiemetic) IV Soln 5 MG/2ML: INTRAVENOUS | Qty: 4 | Status: AC

## 2024-11-03 SURGICAL SUPPLY — 50 items
BARRIER ADHS 3X4 INTERCEED (GAUZE/BANDAGES/DRESSINGS) IMPLANT
COVER BACK TABLE 60X90IN (DRAPES) ×3 IMPLANT
COVER TIP SHEARS 8 DVNC (MISCELLANEOUS) ×3 IMPLANT
DEFOGGER SCOPE WARM SEASHARP (MISCELLANEOUS) ×3 IMPLANT
DERMABOND ADVANCED .7 DNX12 (GAUZE/BANDAGES/DRESSINGS) ×3 IMPLANT
DRAPE ARM DVNC X/XI (DISPOSABLE) ×12 IMPLANT
DRAPE COLUMN DVNC XI (DISPOSABLE) ×3 IMPLANT
DRAPE SURG IRRIG POUCH 19X23 (DRAPES) ×3 IMPLANT
DRAPE UTILITY XL STRL (DRAPES) ×3 IMPLANT
DRIVER NDL MEGA SUTCUT DVNCXI (INSTRUMENTS) ×3 IMPLANT
DURAPREP 26ML APPLICATOR (WOUND CARE) ×3 IMPLANT
ELECTRODE REM PT RTRN 9FT ADLT (ELECTROSURGICAL) ×3 IMPLANT
FORCEPS PROGRASP DVNC XI (FORCEP) ×3 IMPLANT
GAUZE 4X4 16PLY ~~LOC~~+RFID DBL (SPONGE) IMPLANT
GLOVE BIOGEL PI IND STRL 7.0 (GLOVE) ×6 IMPLANT
GLOVE NEODERM STER SZ 7 (GLOVE) ×9 IMPLANT
GOWN STRL REUS W/ TWL LRG LVL3 (GOWN DISPOSABLE) ×3 IMPLANT
HOLDER FOLEY CATH W/STRAP (MISCELLANEOUS) IMPLANT
IRRIGATION SUCT STRKRFLW 2 WTP (MISCELLANEOUS) ×3 IMPLANT
KIT PINK PAD W/HEAD ARM REST (MISCELLANEOUS) ×3 IMPLANT
KIT TURNOVER KIT B (KITS) ×3 IMPLANT
LEGGING LITHOTOMY PAIR STRL (DRAPES) ×3 IMPLANT
MANIFOLD NEPTUNE II (INSTRUMENTS) ×3 IMPLANT
OBTURATOR OPTICALSTD 8 DVNC (TROCAR) ×3 IMPLANT
OCCLUDER COLPOPNEUMO (BALLOONS) IMPLANT
PACK CYSTO (CUSTOM PROCEDURE TRAY) ×3 IMPLANT
PACK ROBOT WH (CUSTOM PROCEDURE TRAY) ×3 IMPLANT
PACK ROBOTIC GOWN (GOWN DISPOSABLE) ×3 IMPLANT
PAD OB MATERNITY 11 LF (PERSONAL CARE ITEMS) ×3 IMPLANT
POWDER SURGICEL 3.0 GRAM (HEMOSTASIS) IMPLANT
RUMI II 3.0CM BLUE KOH-EFFICIE (DISPOSABLE) IMPLANT
RUMI II GYRUS 2.5CM BLUE (DISPOSABLE) IMPLANT
RUMI II GYRUS 3.5CM BLUE (DISPOSABLE) IMPLANT
RUMI II GYRUS 4.0CM BLUE (DISPOSABLE) IMPLANT
SCISSORS MNPLR CVD DVNC XI (INSTRUMENTS) ×3 IMPLANT
SEAL UNIV 5-12 XI (MISCELLANEOUS) ×9 IMPLANT
SEALER VESSEL EXT DVNC XI (MISCELLANEOUS) IMPLANT
SET CYSTO IRRIGATION (SET/KITS/TRAYS/PACK) ×3 IMPLANT
SET TUBE SMOKE EVAC HIGH FLOW (TUBING) ×3 IMPLANT
SOLN 0.9% NACL POUR BTL 1000ML (IV SOLUTION) ×3 IMPLANT
SPIKE FLUID TRANSFER (MISCELLANEOUS) ×3 IMPLANT
SUT MNCRL AB 4-0 PS2 18 (SUTURE) ×3 IMPLANT
SUT VLOC 180 0 9IN GS21 (SUTURE) ×6 IMPLANT
TIP ENDOSCOPIC SURGICEL (TIP) IMPLANT
TIP RUMI ORANGE 6.7MMX12CM (TIP) IMPLANT
TIP UTERINE 6.7X10CM GRN DISP (MISCELLANEOUS) IMPLANT
TIP UTERINE 6.7X8CM BLUE DISP (MISCELLANEOUS) IMPLANT
TOWEL GREEN STERILE (TOWEL DISPOSABLE) ×3 IMPLANT
TRAY FOLEY W/BAG SLVR 14FR (SET/KITS/TRAYS/PACK) ×3 IMPLANT
UNDERPAD 30X36 HEAVY ABSORB (UNDERPADS AND DIAPERS) ×3 IMPLANT

## 2024-11-03 NOTE — Discharge Instructions (Signed)
°  Post Anesthesia Home Care Instructions  Activity: Get plenty of rest for the remainder of the day. A responsible individual must stay with you for 24 hours following the procedure.  For the next 24 hours, DO NOT: -Drive a car -Advertising copywriter -Drink alcoholic beverages -Take any medication unless instructed by your physician -Make any legal decisions or sign important papers.  Meals: Start with liquid foods such as gelatin or soup. Progress to regular foods as tolerated. Avoid greasy, spicy, heavy foods. If nausea and/or vomiting occur, drink only clear liquids until the nausea and/or vomiting subsides. Call your physician if vomiting continues.  Special Instructions/Symptoms: Your throat may feel dry or sore from the anesthesia or the breathing tube placed in your throat during surgery. If this causes discomfort, gargle with warm salt water. The discomfort should disappear within 24 hours.  If you had a scopolamine patch placed behind your ear for the management of post- operative nausea and/or vomiting:  1. The medication in the patch is effective for 72 hours, after which it should be removed.  Wrap patch in a tissue and discard in the trash. Wash hands thoroughly with soap and water. 2. You may remove the patch earlier than 72 hours if you experience unpleasant side effects which may include dry mouth, dizziness or visual disturbances. 3. Avoid touching the patch. Wash your hands with soap and water after contact with the patch.  May take Tylenol  after 12:21 pm.You received Tylenol  1,000 mg at 6:21 am.

## 2024-11-03 NOTE — Transfer of Care (Signed)
 Immediate Anesthesia Transfer of Care Note  Patient: [name] [name]  Procedure(s) Performed: HYSTERECTOMY, TOTAL, LAPAROSCOPIC, ROBOT-ASSISTED WITH SALPINGECTOMY (Bilateral: Pelvis) CYSTOSCOPY (Bladder) LYSIS, ADHESIONS, ROBOT-ASSISTED, LAPAROSCOPIC (Abdomen)  Patient Location: PACU  Anesthesia Type:General  Level of Consciousness: sedated and responds to stimulation  Airway & Oxygen Therapy: Patient Spontanous Breathing and Patient connected to nasal cannula oxygen  Post-op Assessment: Report given to RN, Post -op Vital signs reviewed and stable, and Patient moving all extremities  Post vital signs: Reviewed and stable  Last Vitals:  Vitals Value Taken Time  BP 110/70 11/03/24 09:05  Temp    Pulse 83 11/03/24 09:06  Resp 17 11/03/24 09:06  SpO2 94 % 11/03/24 09:06  Vitals shown include unfiled device data.  Last Pain:  Vitals:   11/03/24 0614  TempSrc: Oral  PainSc: 0-No pain      Patients Stated Pain Goal: 5 (11/03/24 9385)  Complications: There were no known notable events for this encounter.

## 2024-11-03 NOTE — Interval H&P Note (Signed)
 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  The various methods of treatment have been discussed with the patient and family. After consideration of risks, benefits and other options for treatment, the patient has consented to  Procedures: HYSTERECTOMY, TOTAL, LAPAROSCOPIC, ROBOT-ASSISTED WITH SALPINGECTOMY (Bilateral) CYSTOSCOPY (N/A) as a surgical intervention.  The patient's history has been reviewed, patient examined, no change in status, stable for surgery.  I have reviewed the patient's chart and labs.  Questions were answered to the patient's satisfaction.     [name] [name]

## 2024-11-03 NOTE — Anesthesia Postprocedure Evaluation (Signed)
 Anesthesia Post Note  Patient: [name] [name]  Procedure(s) Performed: HYSTERECTOMY, TOTAL, LAPAROSCOPIC, ROBOT-ASSISTED WITH SALPINGECTOMY (Bilateral: Pelvis) CYSTOSCOPY (Bladder) LYSIS, ADHESIONS, ROBOT-ASSISTED, LAPAROSCOPIC (Abdomen)     Patient location during evaluation: PACU Anesthesia Type: General Level of consciousness: awake and alert Pain management: pain level controlled Vital Signs Assessment: post-procedure vital signs reviewed and stable Respiratory status: spontaneous breathing, nonlabored ventilation and respiratory function stable Cardiovascular status: blood pressure returned to baseline and stable Postop Assessment: no apparent nausea or vomiting Anesthetic complications: no   There were no known notable events for this encounter.  Last Vitals:  Vitals:   11/03/24 0949 11/03/24 1050  BP:  117/82  Pulse: 100 (!) 103  Resp: 16 12  Temp: 36.6 C   SpO2: 98% 97%    Last Pain:  Vitals:   11/03/24 1145  TempSrc:   PainSc: (P) 4                  Garnette FORBES Skillern

## 2024-11-03 NOTE — Op Note (Signed)
 11/03/2024  [id number] [name] [name] [name]        OPERATIVE REPORT   Preop Diagnosis: menorrhagia, dysmenorrhea, anemia, fibroids, h/o ltcs x1 Procedure: robotic hysterectomy, lysis of adhesions, bilateral salpingectomy, cystoscopy   Surgeon: Dr. Almarie Rollo Carpen Assistant:  Circulator: Raguel Sherra CROME, RN Relief Circulator: [name] [name] Seretha SAILOR, RN Scrub Person: [name] [name], [name] [name]  [name] [name] [name] [name] [name] [name] CHRISTELLA RN First Assistant: Starla Duwaine BROCKS, RN    Fluids: please see anesthesia report   Complications: None Anesthesia: General     Findings: Fibroid 12 cm uterus, normal ovaries and tubes Cystoscopy at the end of the case with normal bladder and patent ureters bilaterally. Dense adhesions from the prior cesarean to the lower uterine segment and bladder. Bladder filled and no extravasation seen during the entire procedure. Cystoscopy normal at the end of the case as well.   Estimated blood loss: 25cc   Specimens: Uterus, cervix and bilateral tubes   Disposition of specimen: Pathology           Patient is taken to the operating room. She is placed in the supine position. She is a running IV in place. Informed consent was present on the chart. SCDs on her lower extremities and functioning properly. Patient was positioned while she was awake.  Her legs were placed in the low lithotomy position in Robinson stirrups. Her arms were tucked by the side.  General endotracheal anesthesia was administered by the anesthesia staff without difficulty.       Dura prep was then used to prep the abdomen and Hibiclens was used to prep the inner thighs, perineum and vagina. Once 3 minutes had past the patient was draped in a normal standard fashion. A proper time out was performed and everyone agreed.  The legs were lifted to the high lithotomy position. A bivalve speculum was inserted into the vagina and the anterior lip of the cervix was grasped with single-tooth tenaculum.  The uterus sounded to 12 cm.  Pratt dilators were used to dilate the cervix.  The RUMI uterine manipulator was obtained inserted into the endometrial cavity and the bulb of the disposable tip was inflated with 8 cc of normal saline. There was a good fit of the KOH ring around the cervix. The tenaculum and bivavle speculum was removed. There is also good manipulation of the uterus.  A Foley catheter was placed to straight drain.  Clear urine was noted. Legs were lowered to the low lithotomy position and attention was turned the abdomen.   Superior to the umbilicus, marcaine  0.25% used to anesthetize the skin.  Using #11 blade, 8mm skin incision was made.  The 8mm robotic trocar and sleeve was inserted under direct visualization.  CO2 gas was  started and patient was placed in trendelenburg position.  Two additional 8mm ports were placed under direct visualization in the left and right lower quadrant.     Ureters were identifies.  Attention was turned to the left side. The left tube was elevated and the mesosalpinx was desiccated with the vessel sealer.  The left uterine ovarian pedicle was serially clamped cauterized and incised. Left round ligament was serially clamped cauterized and incised. The anterior and posterior peritoneum of the inferior leaf of the broad ligament were opened. The bladder was back filled and clamped with methylene blue to dissect down the adhesions from the lower uterine segment to the bladder. No injury was noted or fluid seen.  The bladder was taken down below the level of the KOH ring. The left  uterine artery skeletonized and then just superior to the KOH ring this vessel was serially clamped, cauterized, and incised.   Attention was turned the right side.  The uterus was placed on stretch to the opposite side.    The mesosalpinx was incised freeing the tube. Then the right uterine ovarian pedicle was serially clamped cauterized and incised. Next the right round ligament was serially clamped cauterized and  incised. The anterior posterior peritoneum of the inferiorly for the broad ligament were opened. The anterior peritoneum was carried across to the dissection on the left side. The remainder of the bladder flap was created using sharp dissection. The bladder was well below the level of the KOH ring. The right uterine artery skeletonized. Then the right uterine artery, above the level of the KOH ring, was serially clamped cauterized and incised. The uterus was devascularized at this point.   The colpotomy was performed.  This was carried around a circumferential fashion until the vaginal mucosa was completely incised in the specimen was freed.  The specimen was then delivered to the vagina intact.  A vaginal occlusive device was used to maintain the pneumoperitoneum   Instruments were changed with a needle driver and prograsp.  Using a 9 inch  zero V-lock suture, the cuff was closed by incorporating the anterior and posterior vaginal mucosa in each stitch. This was carried across all the way to the left corner and a running fashion. Two stitches were brought back towards the midline and the suture was cut flush with the vagina. The needle was brought out the pelvis. The pelvis was irrigated. All pedicles were inspected. No bleeding was noted.   Co2 pressures were lowered to 8mm Hg.  Again, no bleeding was noted.  Ureters were noted deep in the pelvis to be peristalsing.  At this point the procedure was completed.  The remaining instruments were removed.  The ports were removed under direct visualization of the laparoscope and the pneumoperitoneum was relieved.   The skin was then closed with subcuticular stitches of 3-0 Vicryl. The skin was cleansed Dermabond was applied. Attention was then turned the vagina and the cuff was inspected. No bleeding was noted.  The Foley catheter was removed.  Cystoscopy was performed.  No sutures or bladder injuries were noted.  Ureters were noted with normal urine jets from each  one was seen.  Foley was left out after the cystoscopic fluid was drained and cystoscope removed.  Sponge, lap, needle, instrument counts were correct x2. Patient tolerated the procedure very well. She was awakened from anesthesia, extubated and taken to recovery in stable condition.      Dr. Glennon

## 2024-11-03 NOTE — Anesthesia Procedure Notes (Signed)
 Procedure Name: Intubation Date/Time: 11/03/2024 7:29 AM  Performed by: Chaney Ozell CROME, CRNAPre-anesthesia Checklist: Patient identified, Emergency Drugs available, Suction available and Patient being monitored Patient Re-evaluated:Patient Re-evaluated prior to induction Oxygen Delivery Method: Circle System Utilized Preoxygenation: Pre-oxygenation with 100% oxygen Induction Type: IV induction Ventilation: Mask ventilation without difficulty Laryngoscope Size: Mac and 3 Grade View: Grade I Tube type: Oral Tube size: 7.5 mm Number of attempts: 1 Airway Equipment and Method: Stylet and Oral airway Placement Confirmation: ETT inserted through vocal cords under direct vision, positive ETCO2 and breath sounds checked- equal and bilateral Secured at: 21 cm Tube secured with: Tape Dental Injury: Teeth and Oropharynx as per pre-operative assessment

## 2024-11-04 ENCOUNTER — Encounter (HOSPITAL_COMMUNITY): Payer: Self-pay | Admitting: Obstetrics and Gynecology

## 2024-11-07 ENCOUNTER — Ambulatory Visit: Payer: Self-pay | Admitting: Obstetrics and Gynecology

## 2024-11-07 LAB — SURGICAL PATHOLOGY

## 2024-11-17 ENCOUNTER — Ambulatory Visit (INDEPENDENT_AMBULATORY_CARE_PROVIDER_SITE_OTHER): Admitting: Obstetrics and Gynecology

## 2024-11-17 ENCOUNTER — Encounter: Payer: Self-pay | Admitting: Obstetrics and Gynecology

## 2024-11-17 VITALS — BP 122/78 | HR 105

## 2024-11-17 DIAGNOSIS — Z09 Encounter for follow-up examination after completed treatment for conditions other than malignant neoplasm: Secondary | ICD-10-CM

## 2024-11-17 NOTE — Progress Notes (Signed)
 Patient presents for 2 week postop from [facility], bilateral salpingectomy, cystoscopy. She is doing well. No fevers, VB, dysuria or severe abdominal pain.  BP 122/78 (BP Location: Left Arm, Patient Position: Sitting)   Pulse (!) 105   LMP 09/16/2024   SpO2 98%   Abdomen: incisions I/c/d, NT, ND  A/p PO from [facility] 2 weeks doing well Encouraged no heavy lifting, pushing, pulling greater than 10 lbs for full 8 weeks 2. Pelvic rest until cleared 3. RTC with any concerns or with heavy bleeding, fevers or severe abdominal pain.  Dr. Glennon

## 2024-12-14 ENCOUNTER — Ambulatory Visit (INDEPENDENT_AMBULATORY_CARE_PROVIDER_SITE_OTHER): Admitting: Obstetrics and Gynecology

## 2024-12-14 ENCOUNTER — Encounter: Payer: Self-pay | Admitting: Obstetrics and Gynecology

## 2024-12-14 VITALS — BP 110/64 | HR 113

## 2024-12-14 DIAGNOSIS — Z09 Encounter for follow-up examination after completed treatment for conditions other than malignant neoplasm: Secondary | ICD-10-CM

## 2024-12-14 MED ORDER — NURTEC 75 MG PO TBDP: 1.0000 | ORAL_TABLET | Freq: Every day | ORAL | 3 refills | Status: AC | PRN

## 2024-12-14 NOTE — Progress Notes (Signed)
" ° °  Acute Office Visit  Subjective:    Patient ID: [name] [name], female    DOB: 02-17-1976, 49 y.o.   MRN: [id number]   HPI 49 y.o. presents today for Post-op Follow-up (10 week post op 11/03/24- Total Hysterectomy/) .6 wk RLH  Patient's last menstrual period was 09/16/2024.   SVE: cuff intact with good support, normal discharge. No VB NT to qtip palpation  Review of Systems     Objective:    OBGyn Exam  BP 110/64 (BP Location: Left Arm, Patient Position: Sitting)   Pulse (!) 113   LMP 09/16/2024   SpO2 97%  Wt Readings from Last 3 Encounters:  11/03/24 185 lb (83.9 kg)  10/09/24 185 lb (83.9 kg)  09/05/24 184 lb (83.5 kg)        Jada, CMA was present for the exam   Assessment & Plan:  6 wk RLH Pelvic rest until cleared  Rtc at her 10 wk PO check or sooner with any concerns Resume all other activities  Dr. Glennon Almarie MARLA Glennon  "

## 2024-12-20 ENCOUNTER — Telehealth: Payer: Self-pay

## 2024-12-20 NOTE — Telephone Encounter (Signed)
 PA request received from covermymeds for the [employer]. PA submitted and pt is aware   Key: [id number]

## 2024-12-20 NOTE — Telephone Encounter (Signed)
 Request has been Approved.    Approved today by [employer] CaseId:[id number];Status:Approved;Review Type:Prior Auth;Coverage Start Date:11/20/2024;Coverage End Date:12/20/2025; Effective Date: 11/20/2024 Authorization Expiration Date: 12/20/2025

## 2025-01-04 ENCOUNTER — Encounter: Admitting: Obstetrics and Gynecology

## 2025-01-25 ENCOUNTER — Encounter: Admitting: Obstetrics and Gynecology
